# Patient Record
Sex: Male | Born: 1970 | Race: Black or African American | Hispanic: No | Marital: Married | Smoking: Never smoker
Health system: Southern US, Community
[De-identification: ages and names within clinical notes are randomized; demographics above are authoritative.]

## PROBLEM LIST (undated history)

## (undated) DIAGNOSIS — M1990 Unspecified osteoarthritis, unspecified site: Secondary | ICD-10-CM

## (undated) DIAGNOSIS — J45909 Unspecified asthma, uncomplicated: Secondary | ICD-10-CM

## (undated) HISTORY — PX: MENISCUS REPAIR: SHX5179

## (undated) HISTORY — PX: SHOULDER ARTHROSCOPY WITH ROTATOR CUFF REPAIR: SHX5685

## (undated) HISTORY — PX: WRIST SURGERY: SHX841

---

## 1999-07-18 ENCOUNTER — Emergency Department (HOSPITAL_COMMUNITY): Admission: EM | Admit: 1999-07-18 | Discharge: 1999-07-19 | Payer: Self-pay | Admitting: Emergency Medicine

## 1999-07-19 ENCOUNTER — Encounter: Payer: Self-pay | Admitting: Emergency Medicine

## 2003-10-26 ENCOUNTER — Emergency Department (HOSPITAL_COMMUNITY): Admission: EM | Admit: 2003-10-26 | Discharge: 2003-10-26 | Payer: Self-pay | Admitting: Emergency Medicine

## 2003-12-25 ENCOUNTER — Emergency Department (HOSPITAL_COMMUNITY): Admission: EM | Admit: 2003-12-25 | Discharge: 2003-12-25 | Payer: Self-pay | Admitting: Emergency Medicine

## 2003-12-28 ENCOUNTER — Emergency Department (HOSPITAL_COMMUNITY): Admission: EM | Admit: 2003-12-28 | Discharge: 2003-12-28 | Payer: Self-pay | Admitting: *Deleted

## 2007-03-04 ENCOUNTER — Emergency Department (HOSPITAL_COMMUNITY): Admission: EM | Admit: 2007-03-04 | Discharge: 2007-03-04 | Payer: Self-pay | Admitting: Emergency Medicine

## 2014-06-26 ENCOUNTER — Other Ambulatory Visit (HOSPITAL_COMMUNITY): Payer: Self-pay | Admitting: Respiratory Therapy

## 2014-06-26 DIAGNOSIS — J45909 Unspecified asthma, uncomplicated: Secondary | ICD-10-CM

## 2014-06-28 ENCOUNTER — Inpatient Hospital Stay (HOSPITAL_COMMUNITY): Admission: RE | Admit: 2014-06-28 | Payer: Self-pay | Source: Ambulatory Visit

## 2014-07-17 ENCOUNTER — Ambulatory Visit (HOSPITAL_COMMUNITY)
Admission: RE | Admit: 2014-07-17 | Discharge: 2014-07-17 | Disposition: A | Discharge status: Home / Self Care | Payer: Managed Care, Other (non HMO) | Source: Ambulatory Visit | Attending: Family Medicine | Admitting: Family Medicine

## 2014-07-17 DIAGNOSIS — J45909 Unspecified asthma, uncomplicated: Secondary | ICD-10-CM | POA: Insufficient documentation

## 2014-07-17 MED ORDER — ALBUTEROL SULFATE (2.5 MG/3ML) 0.083% IN NEBU
2.5000 mg | INHALATION_SOLUTION | Freq: Once | RESPIRATORY_TRACT | Status: AC
  Administered 2014-07-17: 2.5 mg via RESPIRATORY_TRACT

## 2014-07-18 LAB — PULMONARY FUNCTION TEST
DL/VA % pred: 97 %
DL/VA: 4.7 ml/min/mmHg/L
DLCO cor % pred: 86 %
DLCO cor: 31.38 ml/min/mmHg
DLCO unc % pred: 86 %
DLCO unc: 31.38 ml/min/mmHg
FEF 25-75 Post: 4.56 L/sec
FEF 25-75 Pre: 3.86 L/sec
FEF2575-%Change-Post: 18 %
FEF2575-%Pred-Post: 118 %
FEF2575-%Pred-Pre: 99 %
FEV1-%Change-Post: 2 %
FEV1-%Pred-Post: 102 %
FEV1-%Pred-Pre: 100 %
FEV1-Post: 3.96 L
FEV1-Pre: 3.86 L
FEV1FVC-%Change-Post: 1 %
FEV1FVC-%Pred-Pre: 102 %
FEV6-%Change-Post: 0 %
FEV6-%Pred-Post: 100 %
FEV6-%Pred-Pre: 99 %
FEV6-Post: 4.7 L
FEV6-Pre: 4.66 L
FEV6FVC-%Change-Post: 0 %
FEV6FVC-%Pred-Post: 101 %
FEV6FVC-%Pred-Pre: 102 %
FVC-%Change-Post: 1 %
FVC-%Pred-Post: 99 %
FVC-%Pred-Pre: 97 %
FVC-Post: 4.72 L
FVC-Pre: 4.66 L
Post FEV1/FVC ratio: 84 %
Post FEV6/FVC ratio: 100 %
Pre FEV1/FVC ratio: 83 %
Pre FEV6/FVC Ratio: 100 %
RV % pred: 45 %
RV: 0.94 L
TLC % pred: 73 %
TLC: 5.58 L

## 2014-10-05 DIAGNOSIS — Y9289 Other specified places as the place of occurrence of the external cause: Secondary | ICD-10-CM | POA: Insufficient documentation

## 2014-10-05 DIAGNOSIS — S8391XA Sprain of unspecified site of right knee, initial encounter: Secondary | ICD-10-CM | POA: Diagnosis not present

## 2014-10-05 DIAGNOSIS — S43402A Unspecified sprain of left shoulder joint, initial encounter: Secondary | ICD-10-CM | POA: Insufficient documentation

## 2014-10-05 DIAGNOSIS — S233XXA Sprain of ligaments of thoracic spine, initial encounter: Secondary | ICD-10-CM | POA: Insufficient documentation

## 2014-10-05 DIAGNOSIS — W1839XA Other fall on same level, initial encounter: Secondary | ICD-10-CM | POA: Diagnosis not present

## 2014-10-05 DIAGNOSIS — Y998 Other external cause status: Secondary | ICD-10-CM | POA: Insufficient documentation

## 2014-10-05 DIAGNOSIS — S29012A Strain of muscle and tendon of back wall of thorax, initial encounter: Secondary | ICD-10-CM | POA: Diagnosis not present

## 2014-10-05 DIAGNOSIS — S63502A Unspecified sprain of left wrist, initial encounter: Secondary | ICD-10-CM | POA: Diagnosis not present

## 2014-10-05 DIAGNOSIS — S6992XA Unspecified injury of left wrist, hand and finger(s), initial encounter: Secondary | ICD-10-CM | POA: Diagnosis present

## 2014-10-05 DIAGNOSIS — Y9389 Activity, other specified: Secondary | ICD-10-CM | POA: Insufficient documentation

## 2014-10-05 DIAGNOSIS — S39012A Strain of muscle, fascia and tendon of lower back, initial encounter: Secondary | ICD-10-CM | POA: Insufficient documentation

## 2014-10-06 ENCOUNTER — Encounter (HOSPITAL_BASED_OUTPATIENT_CLINIC_OR_DEPARTMENT_OTHER): Payer: Self-pay | Admitting: *Deleted

## 2014-10-06 ENCOUNTER — Emergency Department (HOSPITAL_BASED_OUTPATIENT_CLINIC_OR_DEPARTMENT_OTHER)
Admission: EM | Admit: 2014-10-06 | Discharge: 2014-10-06 | Disposition: A | Discharge status: Home / Self Care | Payer: Worker's Compensation | Attending: Emergency Medicine | Admitting: Emergency Medicine

## 2014-10-06 ENCOUNTER — Emergency Department (HOSPITAL_BASED_OUTPATIENT_CLINIC_OR_DEPARTMENT_OTHER): Payer: Worker's Compensation

## 2014-10-06 DIAGNOSIS — W19XXXA Unspecified fall, initial encounter: Secondary | ICD-10-CM

## 2014-10-06 DIAGNOSIS — S8391XA Sprain of unspecified site of right knee, initial encounter: Secondary | ICD-10-CM

## 2014-10-06 DIAGNOSIS — S29012A Strain of muscle and tendon of back wall of thorax, initial encounter: Secondary | ICD-10-CM

## 2014-10-06 DIAGNOSIS — S43402A Unspecified sprain of left shoulder joint, initial encounter: Secondary | ICD-10-CM

## 2014-10-06 DIAGNOSIS — S233XXA Sprain of ligaments of thoracic spine, initial encounter: Secondary | ICD-10-CM

## 2014-10-06 DIAGNOSIS — S39012A Strain of muscle, fascia and tendon of lower back, initial encounter: Secondary | ICD-10-CM

## 2014-10-06 DIAGNOSIS — S63502A Unspecified sprain of left wrist, initial encounter: Secondary | ICD-10-CM

## 2014-10-06 HISTORY — PX: SHOULDER ARTHROSCOPY WITH ROTATOR CUFF REPAIR: SHX5685

## 2014-10-06 MED ORDER — HYDROCODONE-ACETAMINOPHEN 5-325 MG PO TABS: 1.0000 | ORAL_TABLET | Freq: Four times a day (QID) | ORAL | Status: DC | PRN

## 2014-10-06 MED ORDER — HYDROCODONE-ACETAMINOPHEN 5-325 MG PO TABS
2.0000 | ORAL_TABLET | Freq: Once | ORAL | Status: AC
  Administered 2014-10-06: 2 via ORAL
  Filled 2014-10-06: qty 2

## 2014-10-06 NOTE — ED Notes (Signed)
Tolerating fluids at d/c, drinking soda, ambulatory to d/c desk, given Rx x1, steady slow cautious gait, sling and splint in place. Pt's supervisor here to drive pt home.

## 2014-10-06 NOTE — ED Notes (Signed)
Pt. Reports he was climbing in a truck and the steps broke causing him to fall to the ground.  Pt. Reports he flipped to the ground injuring his L shoulder and R knee and lower back.  Pt. Is able to walk and stand with no difficulty.  Pt. Reports also reports stiffness in the L wrist.

## 2014-10-06 NOTE — ED Provider Notes (Signed)
CSN: [id number]     Arrival date & time 10/05/14  2358 History   First MD Initiated Contact with Patient 10/06/14 0139     Chief Complaint  Patient presents with  . Fall     (Consider location/radiation/quality/duration/timing/severity/associated sxs/prior Treatment) HPI  This is a 44 year old male who was climbing out of a truck about 11 PM yesterday evening. The steps broke and he fell to the ground. He is complaining of severe pain and decreased range of motion in his left shoulder, pain and decreased range of motion in his left wrist with paresthesias of the fingers of that hand. He is also having pain in his lower back in his anterolateral right knee. There is no associated deformity. There was no loss of consciousness. Pain is worse with palpation or movement.  History reviewed. No pertinent past medical history. Past Surgical History  Procedure Laterality Date  . Shoulder arthroscopy with rotator cuff repair      Right   No family history on file. History  Substance Use Topics  . Smoking status: Never Smoker   . Smokeless tobacco: Not on file  . Alcohol Use: Not on file    Review of Systems  All other systems reviewed and are negative.   Allergies  Review of patient's allergies indicates no known allergies.  Home Medications   Prior to Admission medications   Not on File   BP 153/96 mmHg  Pulse 72  Temp(Src) 98.5 F (36.9 C) (Oral)  Resp 18  Ht 6' (1.829 m)  Wt 240 lb (108.863 kg)  BMI 32.54 kg/m2  SpO2 98%   Physical Exam  General: Well-developed, well-nourished male in no acute distress; appearance consistent with age of record HENT: normocephalic; atraumatic Eyes: pupils equal, round and reactive to light; extraocular muscles intact Neck: supple; no C-spine tenderness Heart: regular rate and rhythm Lungs: clear to auscultation bilaterally Chest: Nontender Abdomen: soft; nondistended; nontender; bowel sounds present Back: Mild midthoracic tenderness  without step off; moderate lumbar tenderness without step off Extremities: No deformity; full range of motion except for left shoulder and left wrist limited due to pain; pulses normal; tenderness, mild swelling and pain on attempted range of motion of left shoulder; tenderness and pain on attempted range of left wrist with positive snuffbox tenderness; mild right anterolateral knee tenderness without swelling, effusion or instability Neurologic: Awake, alert and oriented; motor function intact in all extremities and symmetric; no facial droop Skin: Warm and dry Psychiatric: Normal mood and affect    ED Course  Procedures (including critical care time)   MDM  Nursing notes and vitals signs, including pulse oximetry, reviewed.  Summary of this visit's results, reviewed by myself:  Imaging Studies: Dg Thoracic Spine 2 View  10/06/2014   CLINICAL DATA:  Larey Seat this morning while getting out of truck, step broke. Mid and upper back pain.  EXAM: THORACIC SPINE - 2 VIEW  COMPARISON:  None.  FINDINGS: There is no evidence of thoracic spine fracture. Alignment is normal. Multilevel mild degenerative discs. No other significant bone abnormalities are identified.  IMPRESSION: No acute fracture deformity or malalignment.  Multilevel mild degenerative discs.   Electronically Signed   By: Awilda Metro   On: 10/06/2014 02:47   Dg Lumbar Spine Complete  10/06/2014   CLINICAL DATA:  Larey Seat this morning while getting out of truck, step broke. Mid and upper back pain.  EXAM: LUMBAR SPINE - COMPLETE 4+ VIEW  COMPARISON:  None.  FINDINGS: Five non rib-bearing lumbar-type  vertebral bodies are intact and aligned with maintenance of the lumbar lordosis. No pars interarticularis defects. Intervertebral disc heights are normal. No destructive bony lesions.  Sacroiliac joints are symmetric. Included prevertebral and paraspinal soft tissue planes are non-suspicious.  IMPRESSION: Negative.   Electronically Signed   By:  Awilda Metro   On: 10/06/2014 02:48   Dg Wrist Complete Left  10/06/2014   CLINICAL DATA:  Larey Seat this morning while getting out of truck, step broke. Mid and upper back pain.  EXAM: LEFT WRIST - COMPLETE 3+ VIEW  COMPARISON:  None.  FINDINGS: No acute fracture deformity or dislocation. Mild radial ulnar osteoarthrosis. Foreshortened ulnar styloid suggest remote injury. Joint space intact without erosions. No destructive bony lesions. Soft tissue planes are not suspicious.  IMPRESSION: No acute osseous process.   Electronically Signed   By: Awilda Metro   On: 10/06/2014 02:50   Dg Shoulder Left  10/06/2014   CLINICAL DATA:  Larey Seat this morning while getting out of truck, step broke. Mid and upper back pain.  EXAM: LEFT SHOULDER - 2+ VIEW  COMPARISON:  None.  FINDINGS: There is no evidence of fracture or dislocation. There is no evidence of arthropathy or other focal bone abnormality. Soft tissues are unremarkable.  IMPRESSION: Negative.   Electronically Signed   By: Awilda Metro   On: 10/06/2014 02:48   Dg Knee Complete 4 Views Right  10/06/2014   CLINICAL DATA:  Larey Seat this morning while getting out of truck, step broke. Mid and upper back pain.  EXAM: RIGHT KNEE - COMPLETE 4+ VIEW  COMPARISON:  None.  FINDINGS: No acute fracture deformity or dislocation. Joint space intact without erosions. Mild tibial spine peaking. No destructive bony lesions. Small suprapatellar joint effusion.  IMPRESSION: Small suprapatellar joint effusion without fracture deformity or dislocation.   Electronically Signed   By: Awilda Metro   On: 10/06/2014 02:54   3:12 AM We'll splint patient's left wrist for possible occult scaphoid fracture. He will follow-up with [facility].   Hanley Seamen, MD 10/06/14 [phone]

## 2014-10-06 NOTE — ED Notes (Signed)
Dr. Molpus at BS 

## 2015-07-26 ENCOUNTER — Other Ambulatory Visit: Payer: Self-pay | Admitting: Allergy and Immunology

## 2015-07-26 NOTE — Telephone Encounter (Signed)
Refilled patients Proair. Patient is due for office visit at the end of the year per Dr Willa RoughHicks dictation

## 2015-09-19 ENCOUNTER — Ambulatory Visit
Admission: RE | Admit: 2015-09-19 | Discharge: 2015-09-19 | Disposition: A | Discharge status: Home / Self Care | Payer: Managed Care, Other (non HMO) | Source: Ambulatory Visit | Attending: Physician Assistant | Admitting: Physician Assistant

## 2015-09-19 ENCOUNTER — Other Ambulatory Visit: Payer: Self-pay | Admitting: Physician Assistant

## 2015-12-08 IMAGING — CR DG WRIST COMPLETE 3+V*L*
4 series · 4 of 4 positions shown · non-contrast
Comparison: None.

CLINICAL DATA: Fell this morning while getting out of truck, step
broke. Mid and upper back pain.

EXAM:
LEFT WRIST - COMPLETE 3+ VIEW

[x wrist pa left]
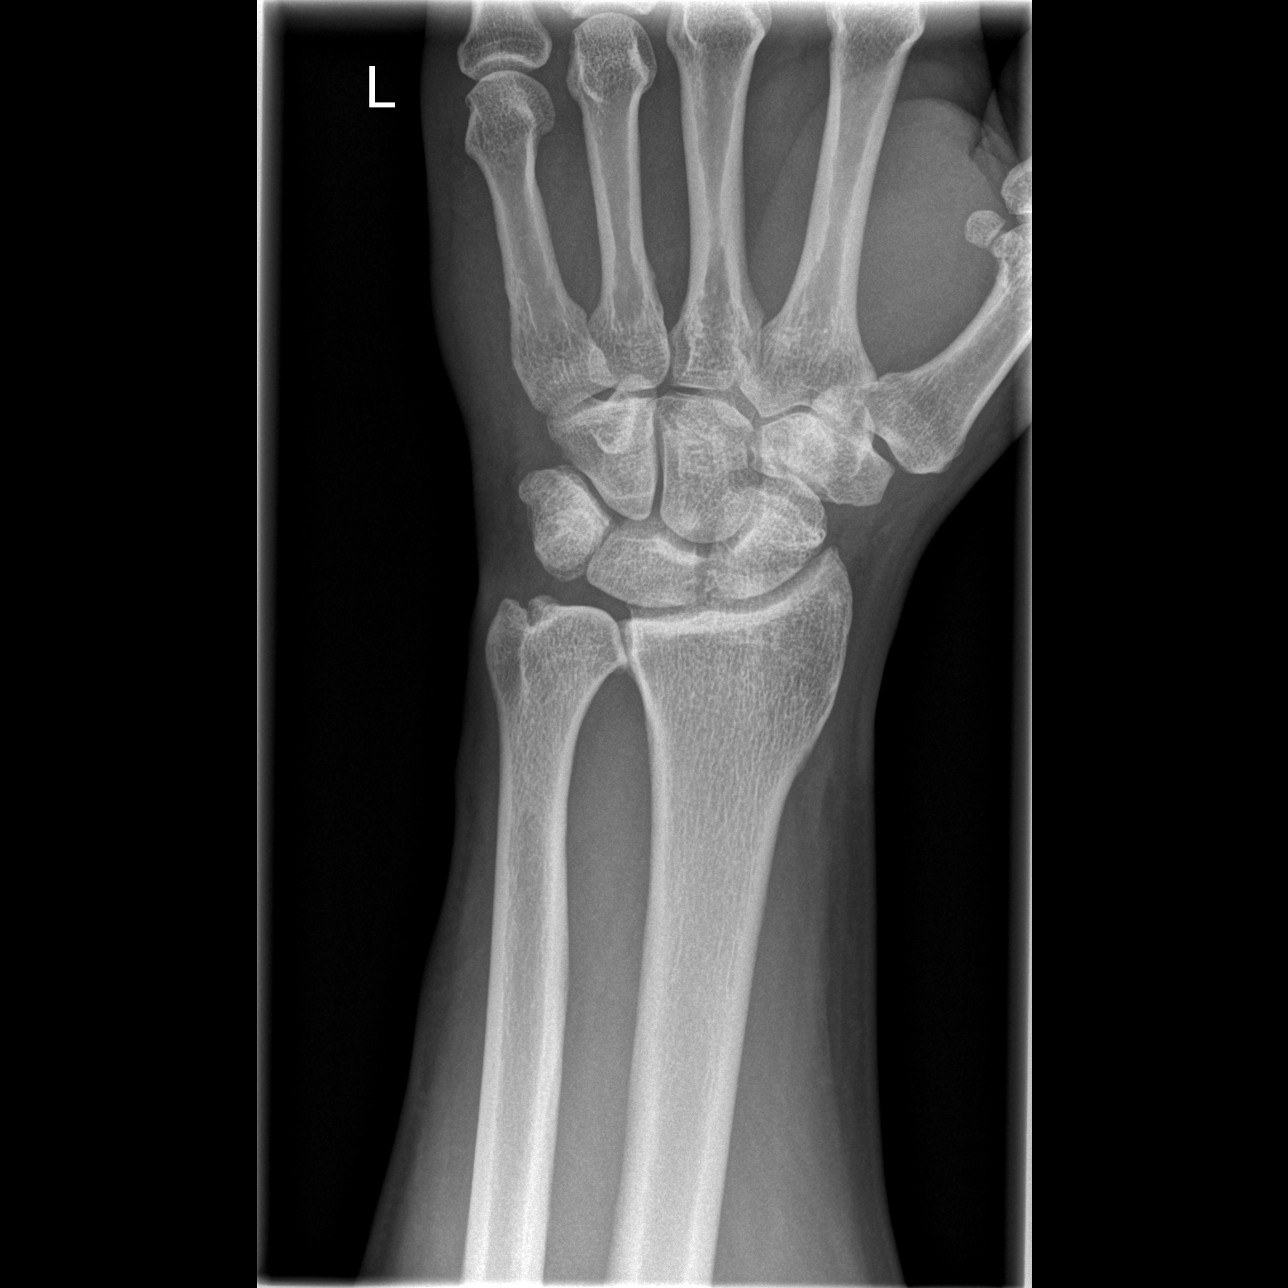

[x wrist obl left]
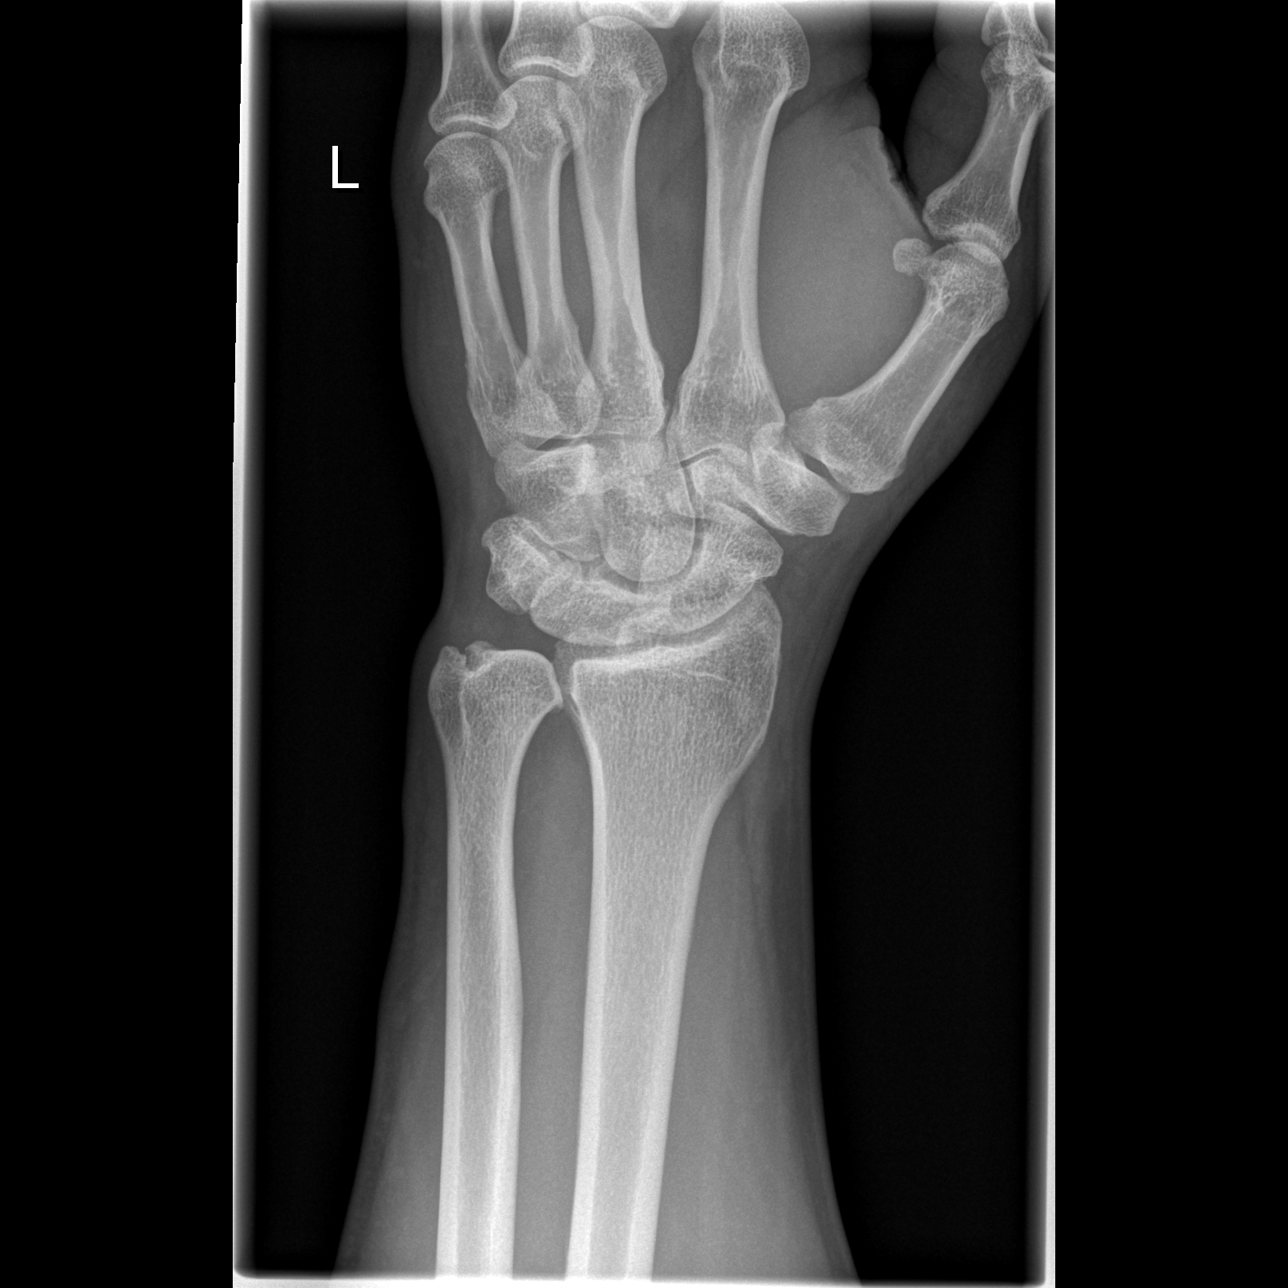

[x wrist lat left]
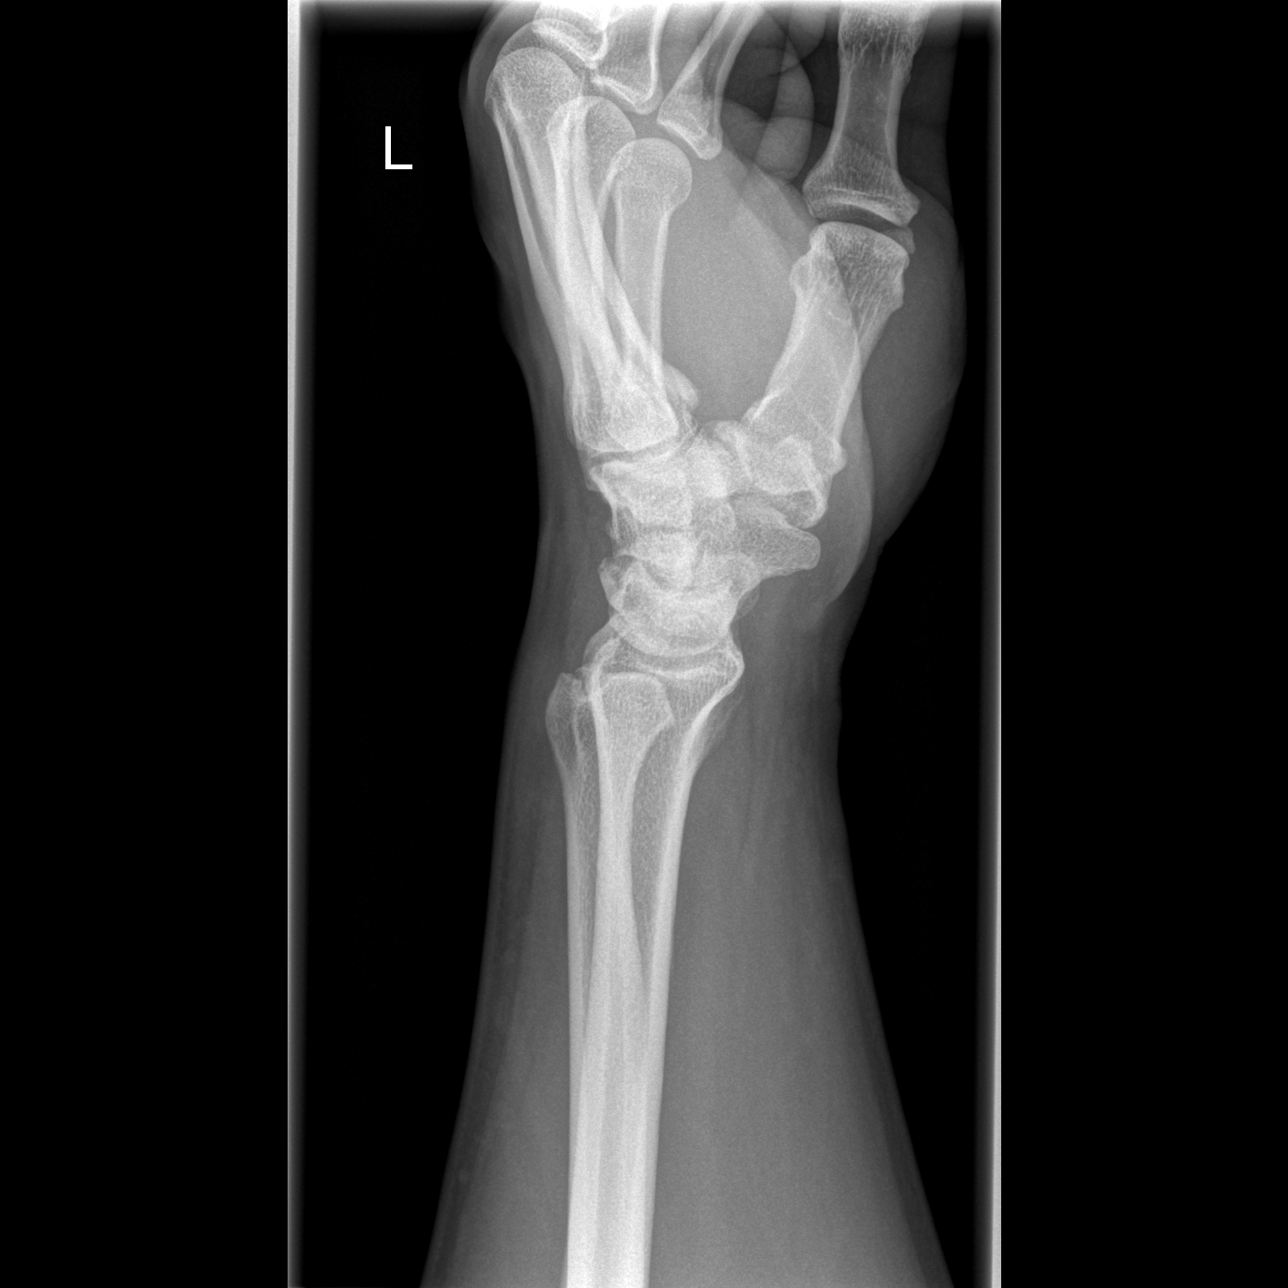

[x navicular]
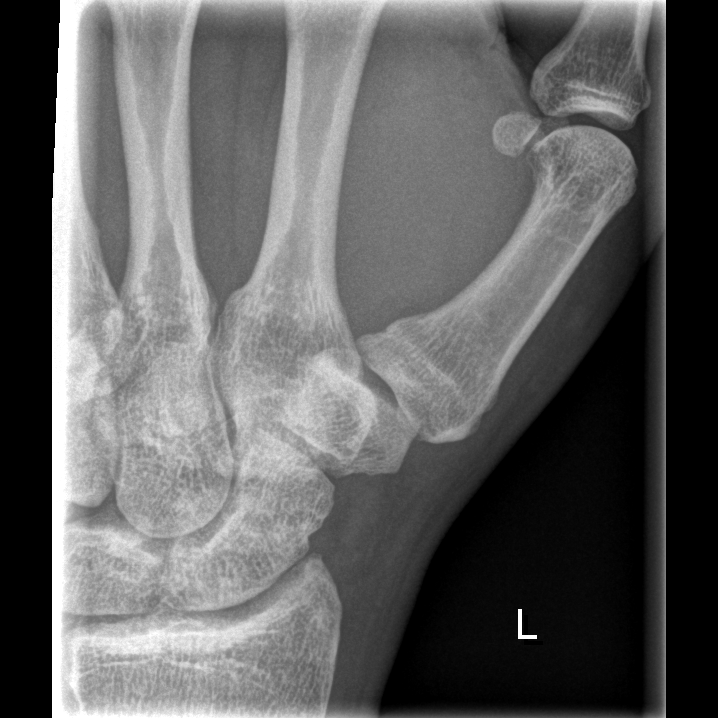

[4 of 4 positions shown; findings below may reference images not displayed]

FINDINGS: No acute fracture deformity or dislocation. Mild radial ulnar
osteoarthrosis. Foreshortened ulnar styloid suggest remote injury.
Joint space intact without erosions. No destructive bony lesions.
Soft tissue planes are not suspicious.
IMPRESSION: No acute osseous process.

  By: Nala Tiger

## 2015-12-08 IMAGING — CR DG LUMBAR SPINE COMPLETE 4+V
5 series · 5 of 5 positions shown · non-contrast
Comparison: None.

CLINICAL DATA: Fell this morning while getting out of truck, step
broke. Mid and upper back pain.

EXAM:
LUMBAR SPINE - COMPLETE 4+ VIEW

[t l-spine a.p.]
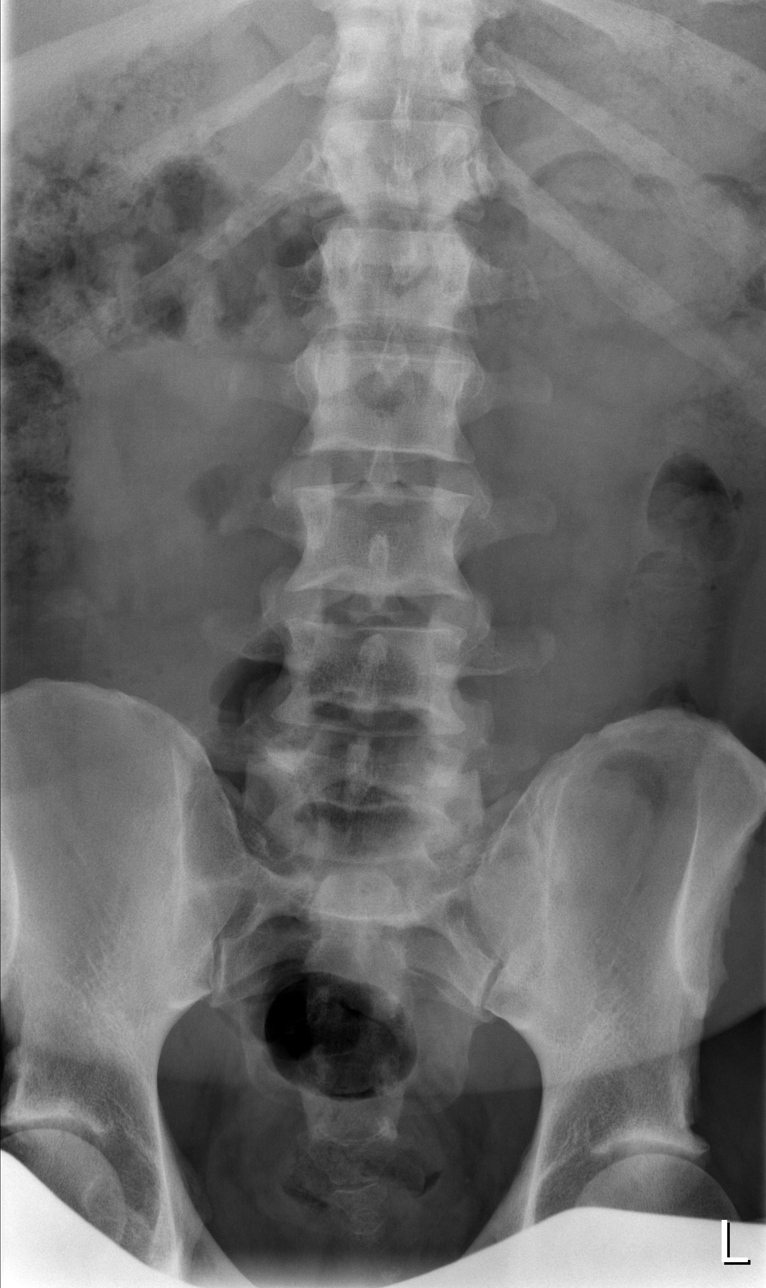

[t l-spine oblique exposure (1 of 2)]
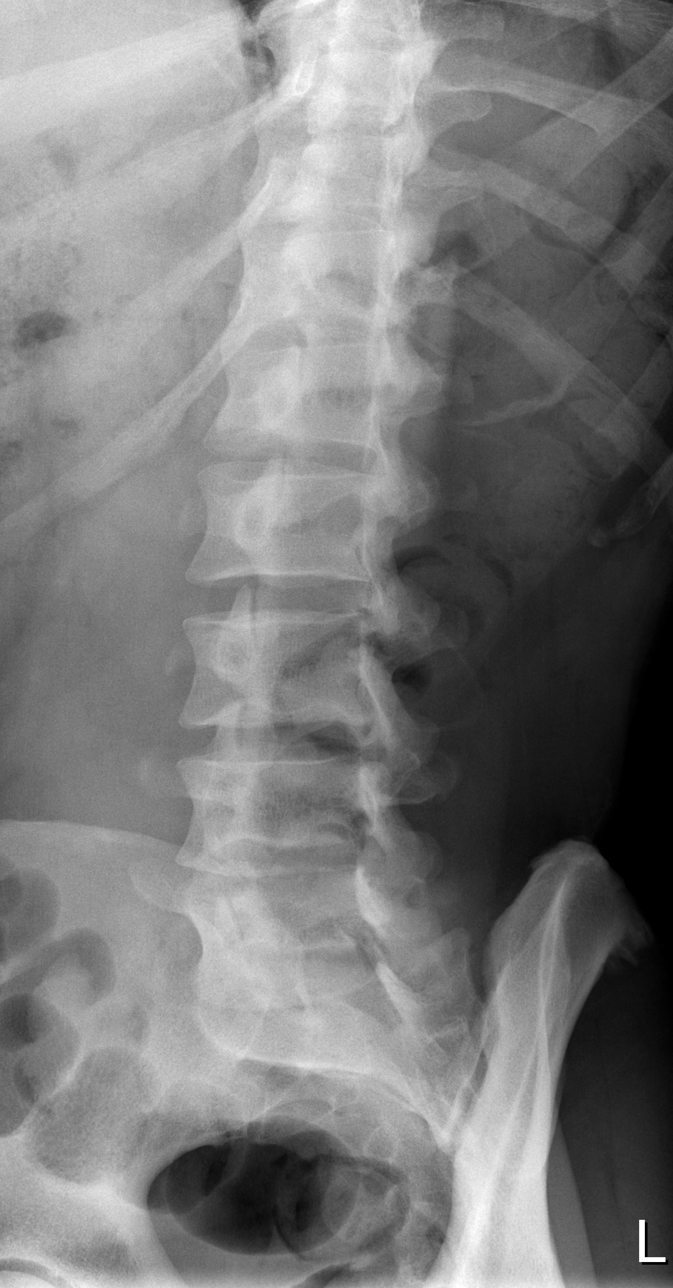

[t l-spine oblique exposure (2 of 2)]
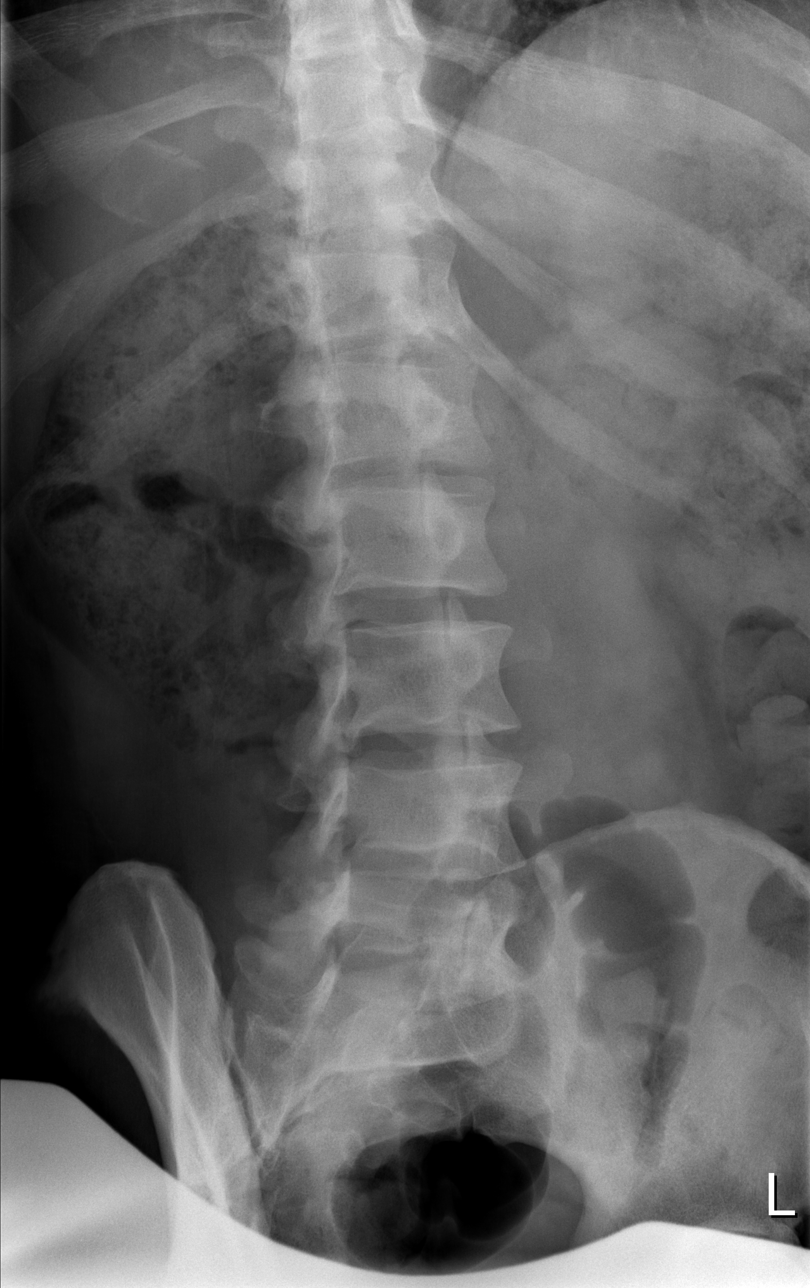

[t l-spine lat]
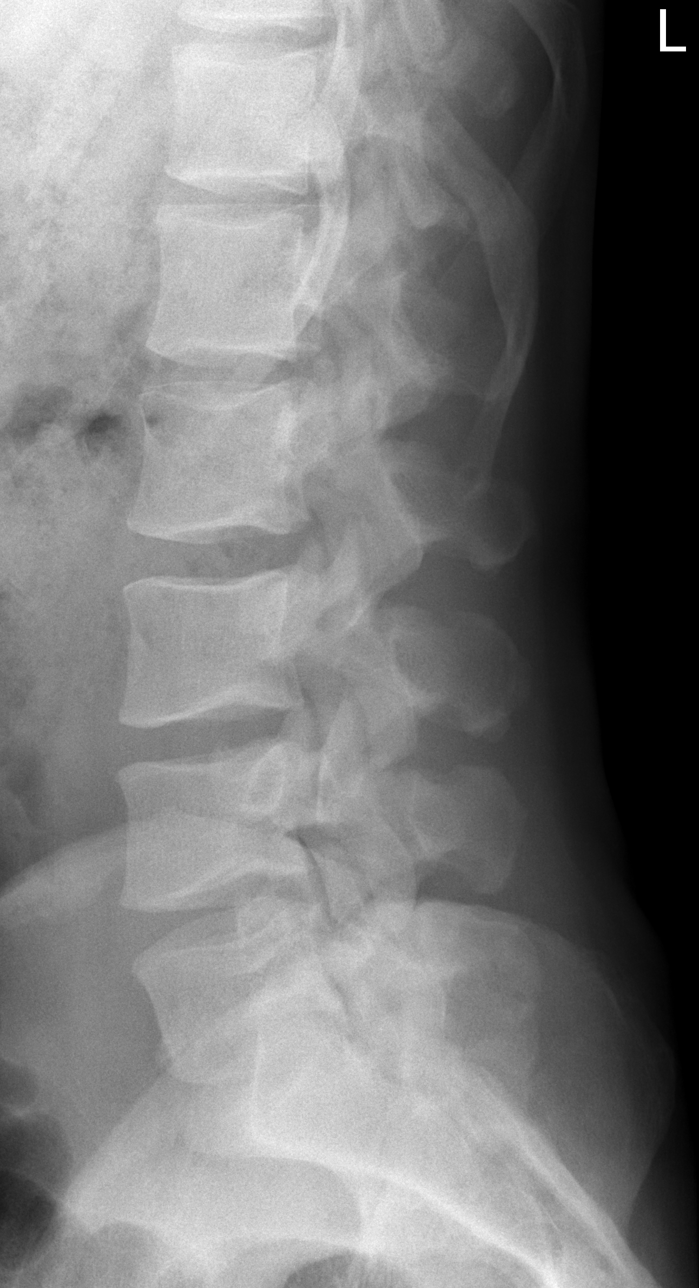

[t l-spine l5-s1 spot]
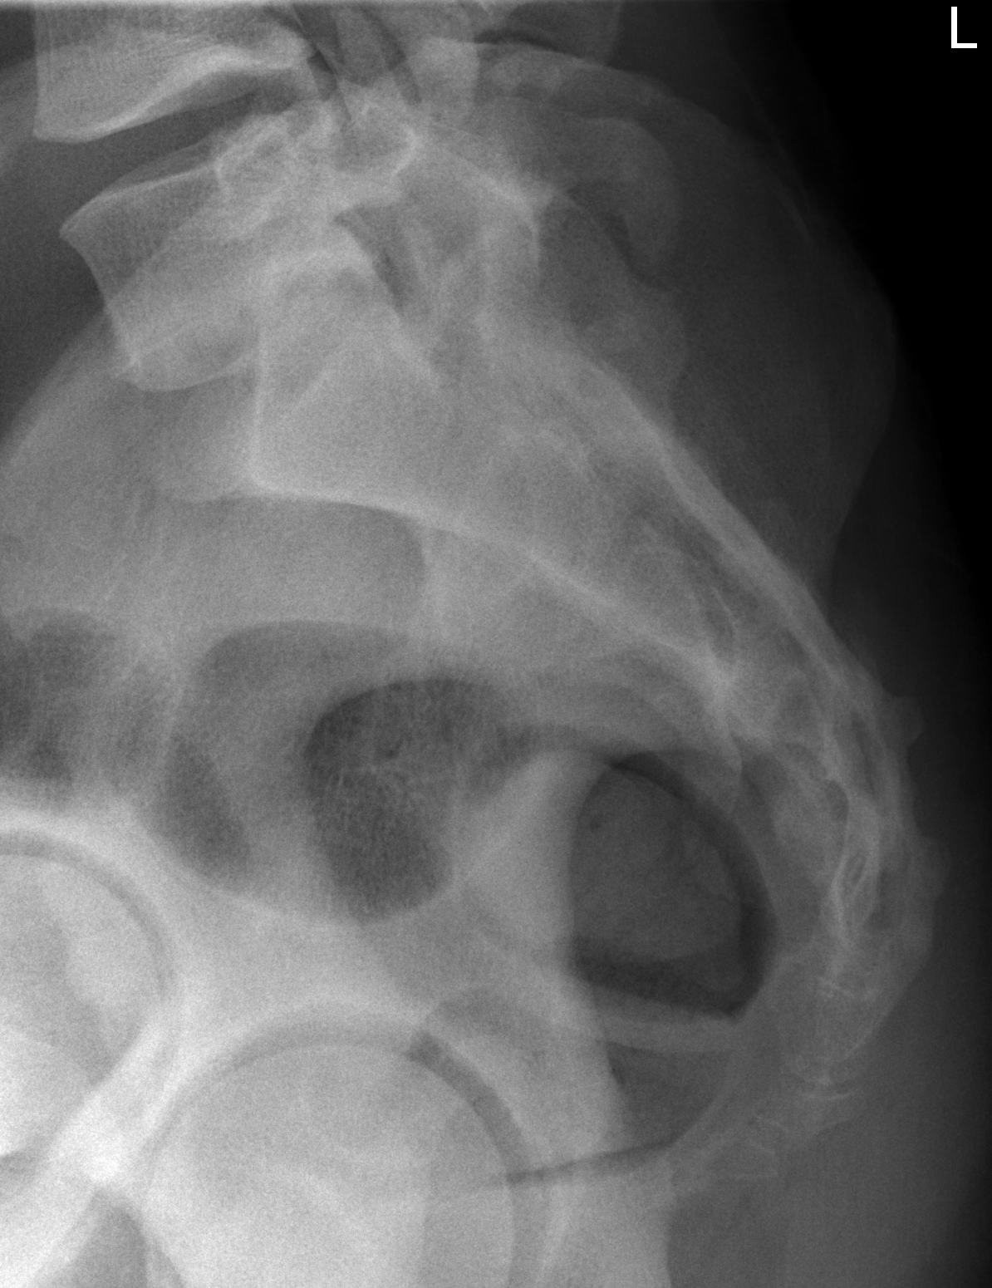

[5 of 5 positions shown; findings below may reference images not displayed]

FINDINGS: Five non rib-bearing lumbar-type vertebral bodies are intact and
aligned with maintenance of the lumbar lordosis. No pars
interarticularis defects. Intervertebral disc heights are normal. No
destructive bony lesions.

Sacroiliac joints are symmetric. Included prevertebral and
paraspinal soft tissue planes are non-suspicious.
IMPRESSION: Negative.

  By: Houssem Edine Syoud

## 2017-10-08 ENCOUNTER — Other Ambulatory Visit: Payer: Self-pay | Admitting: Orthopedic Surgery

## 2017-10-19 ENCOUNTER — Other Ambulatory Visit: Payer: Self-pay | Admitting: Orthopedic Surgery

## 2017-10-20 NOTE — Pre-Procedure Instructions (Signed)
Suszanne ConnersMichael R Puett  10/20/2017      [id number] [place], [place] - [address] [place] [address] Phone: [phone] Fax: [phone]    Your procedure is scheduled on 10/28/2017.  [facility] Ad[facility] at 1045 A.M.  Call this number if you have problems the morning of surgery:  [phone]   Remember:  Do not eat food or drink liquids after midnight.   Continue all medications as directed by your physician except follow these medication instructions before surgery below   Take these medicines the morning of surgery with A SIP OF WATER: Cyclobenzaprine (Flexeril) - if needed for muscle spasms Proair inhaler - if needed  7 days prior to surgery STOP taking any Aspirin (unless otherwise instructed by your Peppers), Aleve, Naproxen, Ibuprofen, Motrin, Advil, Goody's, BC's, all herbal medications, fish oil, and all vitamins    Do not wear jewelry.  Do not wear lotions, powders, or colognes, or deodorant.  Men may shave face and neck.  Do not bring valuables to the hospital.  [facility] is not responsible for any belongings or valuables.  Hearing aids, eyeglasses, contacts, dentures or bridgework may not be worn into surgery.  Leave your suitcase in the car.  After surgery it may be brought to your room.  For patients admitted to the hospital, discharge time will be determined by your treatment team.  Patients discharged the day of surgery will not be allowed to drive home.   Name and phone number of your driver:    Special instructions:   [facility]- Preparing For Surgery  Before surgery, you can play an important role. Because skin is not sterile, your skin needs to be as free of germs as possible. You can reduce the number of germs on your skin by washing with CHG (chlorahexidine gluconate) Soap before surgery.  CHG is an antiseptic cleaner which kills germs and bonds with the skin to continue killing germs even  after washing.  Please do not use if you have an allergy to CHG or antibacterial soaps. If your skin becomes reddened/irritated stop using the CHG.  Do not shave (including legs and underarms) for at least 48 hours prior to first CHG shower. It is OK to shave your face.  Please follow these instructions carefully.   1. Shower the NIGHT BEFORE SURGERY and the MORNING OF SURGERY with CHG.   2. If you chose to wash your hair, wash your hair first as usual with your normal shampoo.  3. After you shampoo, rinse your hair and body thoroughly to remove the shampoo.  4. Use CHG as you would any other liquid soap. You can apply CHG directly to the skin and wash gently with a scrungie or a clean washcloth.   5. Apply the CHG Soap to your body ONLY FROM THE NECK DOWN.  Do not use on open wounds or open sores. Avoid contact with your eyes, ears, mouth and genitals (private parts). Wash Face and genitals (private parts)  with your normal soap.  6. Wash thoroughly, paying special attention to the area where your surgery will be performed.  7. Thoroughly rinse your body with warm water from the neck down.  8. DO NOT shower/wash with your normal soap after using and rinsing off the CHG Soap.  9. Pat yourself dry with a CLEAN TOWEL.  10. Wear CLEAN PAJAMAS to bed the night before surgery, wear comfortable clothes the morning of surgery  11. Place CLEAN SHEETS on your bed the night of your first shower and DO NOT SLEEP WITH PETS.    Day of Surgery: Shower as stated above. Do not apply any deodorants/lotions. Please wear clean clothes to the hospital/surgery center.      Please read over the following fact sheets that you were given.

## 2017-10-21 ENCOUNTER — Encounter (HOSPITAL_COMMUNITY)
Admission: RE | Admit: 2017-10-21 | Discharge: 2017-10-21 | Disposition: A | Discharge status: Home / Self Care | Payer: Worker's Compensation | Source: Ambulatory Visit | Attending: Orthopedic Surgery | Admitting: Orthopedic Surgery

## 2017-10-21 ENCOUNTER — Ambulatory Visit (HOSPITAL_COMMUNITY)
Admission: RE | Admit: 2017-10-21 | Discharge: 2017-10-21 | Disposition: A | Discharge status: Home / Self Care | Payer: 59 | Source: Ambulatory Visit | Attending: Orthopedic Surgery | Admitting: Orthopedic Surgery

## 2017-10-21 ENCOUNTER — Encounter (HOSPITAL_COMMUNITY): Payer: Self-pay

## 2017-10-21 ENCOUNTER — Other Ambulatory Visit: Payer: Self-pay

## 2017-10-21 DIAGNOSIS — Z01810 Encounter for preprocedural cardiovascular examination: Secondary | ICD-10-CM | POA: Diagnosis not present

## 2017-10-21 DIAGNOSIS — Z01812 Encounter for preprocedural laboratory examination: Secondary | ICD-10-CM | POA: Insufficient documentation

## 2017-10-21 DIAGNOSIS — Z01818 Encounter for other preprocedural examination: Secondary | ICD-10-CM | POA: Insufficient documentation

## 2017-10-21 HISTORY — DX: Unspecified asthma, uncomplicated: J45.909

## 2017-10-21 LAB — SURGICAL PCR SCREEN
MRSA, PCR: NEGATIVE
Staphylococcus aureus: NEGATIVE

## 2017-10-21 LAB — URINALYSIS, ROUTINE W REFLEX MICROSCOPIC
Bilirubin Urine: NEGATIVE
GLUCOSE, UA: NEGATIVE mg/dL
Hgb urine dipstick: NEGATIVE
KETONES UR: NEGATIVE mg/dL
LEUKOCYTES UA: NEGATIVE
NITRITE: NEGATIVE
Protein, ur: NEGATIVE mg/dL
Specific Gravity, Urine: 1.019 (ref 1.005–1.030)
pH: 7 (ref 5.0–8.0)

## 2017-10-21 LAB — CBC WITH DIFFERENTIAL/PLATELET
Basophils Absolute: 0 10*3/uL (ref 0.0–0.1)
Basophils Relative: 1 %
Eosinophils Absolute: 0 10*3/uL (ref 0.0–0.7)
Eosinophils Relative: 1 %
HEMATOCRIT: 46 % (ref 39.0–52.0)
HEMOGLOBIN: 15 g/dL (ref 13.0–17.0)
LYMPHS PCT: 46 %
Lymphs Abs: 1.9 10*3/uL (ref 0.7–4.0)
MCH: 24.5 pg — AB (ref 26.0–34.0)
MCHC: 32.6 g/dL (ref 30.0–36.0)
MCV: 75.2 fL — ABNORMAL LOW (ref 78.0–100.0)
MONO ABS: 0.3 10*3/uL (ref 0.1–1.0)
MONOS PCT: 8 %
NEUTROS ABS: 1.8 10*3/uL (ref 1.7–7.7)
NEUTROS PCT: 44 %
Platelets: 100 10*3/uL — ABNORMAL LOW (ref 150–400)
RBC: 6.12 MIL/uL — ABNORMAL HIGH (ref 4.22–5.81)
RDW: 15.2 % (ref 11.5–15.5)
WBC: 4 10*3/uL (ref 4.0–10.5)

## 2017-10-21 LAB — BASIC METABOLIC PANEL
ANION GAP: 8 (ref 5–15)
BUN: 13 mg/dL (ref 6–20)
CO2: 26 mmol/L (ref 22–32)
Calcium: 9.5 mg/dL (ref 8.9–10.3)
Chloride: 109 mmol/L (ref 101–111)
Creatinine, Ser: 1.4 mg/dL — ABNORMAL HIGH (ref 0.61–1.24)
GFR calc non Af Amer: 58 mL/min — ABNORMAL LOW (ref >60)
GLUCOSE: 92 mg/dL (ref 65–99)
Potassium: 4 mmol/L (ref 3.5–5.1)
Sodium: 143 mmol/L (ref 135–145)

## 2017-10-21 LAB — TYPE AND SCREEN
ABO/RH(D): O POS
Antibody Screen: NEGATIVE

## 2017-10-21 LAB — ABO/RH: ABO/RH(D): O POS

## 2017-10-21 LAB — PROTIME-INR
INR: 1.01
Prothrombin Time: 13.2 seconds (ref 11.4–15.2)

## 2017-10-21 LAB — APTT: aPTT: 32 seconds (ref 24–36)

## 2017-10-21 NOTE — Progress Notes (Addendum)
PCP - Dr. Maurice SmallElaine Griffin Cardiologist - patient denies  Chest x-ray - 10/21/17 EKG - 10/21/17 Stress Test - patient denies ECHO - patient denies Cardiac Cath - patient denies  Sleep Study - patient denies  Anesthesia review: yes, abnormal EKG  Patient denies shortness of breath, fever, cough and chest pain at PAT appointment   Patient verbalized understanding of instructions that were given to them at the PAT appointment. Patient was also instructed that they will need to review over the PAT instructions again at home before surgery.

## 2017-10-23 NOTE — Progress Notes (Signed)
Anesthesia Chart Review:  Pt is a 47 year old male scheduled for R total knee arthroplasty on 10/28/2017 with Gean BirchwoodFrank Rowan, MD  - PCP is Maurice SmallElaine Griffin, MD  PMH includes:  Asthma. No hx HTN. Never smoker. BMI 34  Medications include: albuterol, ibuprofen.    BP (!) 153/90   Pulse 77   Temp 37.1 C   Resp 20   Ht 5\' 11"  (1.803 m)   Wt 242 lb 9.6 oz (110 kg)   SpO2 99%   BMI 33.84 kg/m   Preoperative labs reviewed.   - Cr 1.4, BUN 13.  I have not been able to locate comparison labs.  - I spoke with pt by telephone. No hx CKD. Pt will increase water intake. Will recheck renal function day of surgery.   EKG 10/21/17: NSR with sinus arrhythmia. Nonspecific T wave abnormality  I forwarded BP measure and BMET to PCP for follow up purposes.   If no changes, I anticipate pt can proceed with surgery as scheduled.   Rica Mastngela Tyteanna Ost, FNP-BC [facility] Phone: [phone] 10/23/2017 9:46 AM

## 2017-10-27 DIAGNOSIS — R030 Elevated blood-pressure reading, without diagnosis of hypertension: Secondary | ICD-10-CM | POA: Diagnosis not present

## 2017-10-27 DIAGNOSIS — N182 Chronic kidney disease, stage 2 (mild): Secondary | ICD-10-CM | POA: Diagnosis not present

## 2017-10-27 DIAGNOSIS — M1711 Unilateral primary osteoarthritis, right knee: Secondary | ICD-10-CM | POA: Diagnosis present

## 2017-10-27 MED ORDER — BUPIVACAINE LIPOSOME 1.3 % IJ SUSP
20.0000 mL | Freq: Once | INTRAMUSCULAR | Status: AC
  Administered 2017-10-28: 20 mL
  Filled 2017-10-27: qty 20

## 2017-10-27 MED ORDER — SODIUM CHLORIDE 0.9 % IV SOLN
1000.0000 mg | INTRAVENOUS | Status: AC
  Administered 2017-10-28: 1000 mg via INTRAVENOUS
  Filled 2017-10-27: qty 1100

## 2017-10-27 MED ORDER — SODIUM CHLORIDE 0.9 % IV SOLN
2000.0000 mg | INTRAVENOUS | Status: AC
  Administered 2017-10-28: 2000 mg via TOPICAL
  Filled 2017-10-27: qty 10

## 2017-10-27 NOTE — H&P (Signed)
TOTAL KNEE ADMISSION H&P  Patient is being admitted for right total knee arthroplasty.  Subjective:  Chief Complaint:right knee pain.  HPI: Steven ConnersMichael R Ramsey, 47 y.o. male, has a history of pain and functional disability in the right knee due to arthritis and has failed non-surgical conservative treatments for greater than 12 weeks to includeNSAID's and/or analgesics, corticosteriod injections, flexibility and strengthening excercises and activity modification.  Onset of symptoms was gradual, starting several years ago with gradually worsening course since that time. The patient noted prior procedures on the knee to include  arthroscopy on the right knee(s).  Patient currently rates pain in the right knee(s) at 10 out of 10 with activity. Patient has night pain, worsening of pain with activity and weight bearing, pain that interferes with activities of daily living, pain with passive range of motion, crepitus and joint swelling.  Patient has evidence of joint space narrowing by imaging studies.   There is no active infection.  There are no active problems to display for this patient.  Past Medical History:  Diagnosis Date  . Asthma     Past Surgical History:  Procedure Laterality Date  . MENISCUS REPAIR Right   . SHOULDER ARTHROSCOPY WITH ROTATOR CUFF REPAIR     Right  . WRIST SURGERY Left     No current facility-administered medications for this encounter.    Current Outpatient Medications  Medication Sig Dispense Refill Last Dose  . cyclobenzaprine (FLEXERIL) 5 MG tablet Take 5 mg by mouth 3 (three) times daily as needed for muscle spasms.     Marland Kitchen. ibuprofen (ADVIL,MOTRIN) 200 MG tablet Take 400 mg by mouth every 6 (six) hours as needed for headache or moderate pain.     Marland Kitchen. PROAIR HFA 108 (90 BASE) MCG/ACT inhaler INHALE 2 PUFFS EVERY 6 HOURS AS NEEDED FOR COUGH OR WHEEZE *MAY USE 2 PUFFS 10-20 PRIOR TO EXERCISE 1 Inhaler 1   . HYDROcodone-acetaminophen (NORCO/VICODIN) 5-325 MG per tablet  Take 1-2 tablets by mouth every 6 (six) hours as needed (for pain). (Patient not taking: Reported on 10/16/2017) 30 tablet 0 Completed Course at Unknown time   No Known Allergies  Social History   Tobacco Use  . Smoking status: Never Smoker  . Smokeless tobacco: Never Used  Substance Use Topics  . Alcohol use: No    Frequency: Never    No family history on file.   Review of Systems  Constitutional: Negative.   HENT: Negative.   Eyes: Negative.   Respiratory: Negative.   Cardiovascular: Negative.   Gastrointestinal: Negative.   Genitourinary: Negative.   Musculoskeletal: Positive for joint pain.  Skin: Negative.   Neurological: Negative.   Psychiatric/Behavioral: Positive for depression. The patient is nervous/anxious.     Objective:  Physical Exam  Constitutional: He is oriented to person, place, and time. He appears well-developed and well-nourished.  HENT:  Head: Normocephalic and atraumatic.  Eyes: Pupils are equal, round, and reactive to light.  Neck: Normal range of motion. Neck supple.  Cardiovascular: Intact distal pulses.  Respiratory: Effort normal.  Musculoskeletal: He exhibits tenderness.  Patient has a 5 flexion contracture, flexes 120, is tender along the medial greater than lateral joint line and walks with a profound limp even using his cane.  Skin is intact his neurovascular intact.  Collateral ligaments are stable but varus stress exacerbates his pain.    Neurological: He is alert and oriented to person, place, and time.  Skin: Skin is warm and dry.  Psychiatric: He has  a normal mood and affect. His behavior is normal. Judgment and thought content normal.    Vital signs in last 24 hours:    Labs:   Estimated body mass index is 33.84 kg/m as calculated from the following:   Height as of 10/21/17: 5\' 11"  (1.803 m).   Weight as of 10/21/17: 110 kg (242 lb 9.6 oz).   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  An MRI scan that was done a few months before that also documented significant chondromalacia.    AP, Rosenberg, lateral and sunrise x-rays show moderate to severe arthritis of the right greater than left knee on the standing x-rays.  He still has some cartilage in his joints, but medially he is lost about 50%.  Assessment/Plan:  End stage arthritis, right knee   The patient history, physical examination, clinical judgment of the provider and imaging studies are consistent with end stage degenerative joint disease of the right knee(s) and total knee arthroplasty is deemed medically necessary. The treatment options including medical management, injection therapy arthroscopy and arthroplasty were discussed at length. The risks and benefits of total knee arthroplasty were presented and reviewed. The risks due to aseptic loosening, infection, stiffness, patella tracking problems, thromboembolic complications and other imponderables were discussed. The patient acknowledged the explanation, agreed to proceed with the plan and consent was signed. Patient is being admitted for inpatient treatment for surgery, pain control, PT, OT, prophylactic antibiotics, VTE prophylaxis, progressive ambulation and ADL's and discharge planning. The patient is planning to be discharged home with home health services.

## 2017-10-28 ENCOUNTER — Encounter (HOSPITAL_COMMUNITY): Payer: Self-pay

## 2017-10-28 ENCOUNTER — Inpatient Hospital Stay (HOSPITAL_COMMUNITY): Payer: Worker's Compensation | Admitting: Emergency Medicine

## 2017-10-28 ENCOUNTER — Inpatient Hospital Stay (HOSPITAL_COMMUNITY)
Admission: RE | Admit: 2017-10-28 | Discharge: 2017-10-30 | DRG: 470 | Disposition: A | Discharge status: Home Health | Payer: Worker's Compensation | Source: Ambulatory Visit | Attending: Orthopedic Surgery | Admitting: Orthopedic Surgery

## 2017-10-28 ENCOUNTER — Encounter (HOSPITAL_COMMUNITY)
Admission: RE | Disposition: A | Discharge status: Home Health | Payer: Self-pay | Source: Ambulatory Visit | Attending: Orthopedic Surgery

## 2017-10-28 DIAGNOSIS — Z791 Long term (current) use of non-steroidal anti-inflammatories (NSAID): Secondary | ICD-10-CM

## 2017-10-28 DIAGNOSIS — J45909 Unspecified asthma, uncomplicated: Secondary | ICD-10-CM | POA: Diagnosis present

## 2017-10-28 DIAGNOSIS — M25761 Osteophyte, right knee: Secondary | ICD-10-CM | POA: Diagnosis present

## 2017-10-28 DIAGNOSIS — D62 Acute posthemorrhagic anemia: Secondary | ICD-10-CM | POA: Diagnosis not present

## 2017-10-28 DIAGNOSIS — F419 Anxiety disorder, unspecified: Secondary | ICD-10-CM | POA: Diagnosis present

## 2017-10-28 DIAGNOSIS — F329 Major depressive disorder, single episode, unspecified: Secondary | ICD-10-CM | POA: Diagnosis present

## 2017-10-28 DIAGNOSIS — Z79899 Other long term (current) drug therapy: Secondary | ICD-10-CM

## 2017-10-28 DIAGNOSIS — M1711 Unilateral primary osteoarthritis, right knee: Secondary | ICD-10-CM | POA: Diagnosis present

## 2017-10-28 DIAGNOSIS — M94261 Chondromalacia, right knee: Secondary | ICD-10-CM | POA: Diagnosis present

## 2017-10-28 HISTORY — DX: Unspecified osteoarthritis, unspecified site: M19.90

## 2017-10-28 HISTORY — PX: TOTAL KNEE ARTHROPLASTY: SHX125

## 2017-10-28 SURGERY — ARTHROPLASTY, KNEE, TOTAL
Anesthesia: Regional | Site: Knee | Laterality: Right

## 2017-10-28 MED ORDER — MIDAZOLAM HCL 2 MG/2ML IJ SOLN
2.0000 mg | Freq: Once | INTRAMUSCULAR | Status: DC
  Filled 2017-10-28: qty 2

## 2017-10-28 MED ORDER — ALUM & MAG HYDROXIDE-SIMETH 200-200-20 MG/5ML PO SUSP: 30.0000 mL | ORAL | Status: DC | PRN

## 2017-10-28 MED ORDER — METHOCARBAMOL 500 MG PO TABS: 500.0000 mg | ORAL_TABLET | Freq: Two times a day (BID) | ORAL | 0 refills | Status: DC

## 2017-10-28 MED ORDER — ONDANSETRON HCL 4 MG/2ML IJ SOLN
4.0000 mg | Freq: Four times a day (QID) | INTRAMUSCULAR | Status: DC | PRN
  Administered 2017-10-28: 4 mg via INTRAVENOUS
  Filled 2017-10-28: qty 2

## 2017-10-28 MED ORDER — METOCLOPRAMIDE HCL 5 MG/ML IJ SOLN: 5.0000 mg | Freq: Three times a day (TID) | INTRAMUSCULAR | Status: DC | PRN

## 2017-10-28 MED ORDER — DOCUSATE SODIUM 100 MG PO CAPS
100.0000 mg | ORAL_CAPSULE | Freq: Two times a day (BID) | ORAL | Status: DC
  Administered 2017-10-28 – 2017-10-30 (×4): 100 mg via ORAL
  Filled 2017-10-28 (×4): qty 1

## 2017-10-28 MED ORDER — HYDROMORPHONE HCL 1 MG/ML IJ SOLN
0.5000 mg | INTRAMUSCULAR | Status: DC | PRN
  Administered 2017-10-28: 0.5 mg via INTRAVENOUS
  Filled 2017-10-28: qty 1

## 2017-10-28 MED ORDER — FENTANYL CITRATE (PF) 100 MCG/2ML IJ SOLN
INTRAMUSCULAR | Status: AC
  Filled 2017-10-28: qty 2

## 2017-10-28 MED ORDER — OXYCODONE HCL 5 MG PO TABS: 5.0000 mg | ORAL_TABLET | Freq: Once | ORAL | Status: DC | PRN

## 2017-10-28 MED ORDER — ALBUTEROL SULFATE HFA 108 (90 BASE) MCG/ACT IN AERS: 2.0000 | INHALATION_SPRAY | Freq: Four times a day (QID) | RESPIRATORY_TRACT | Status: DC | PRN

## 2017-10-28 MED ORDER — ONDANSETRON HCL 4 MG PO TABS: 4.0000 mg | ORAL_TABLET | Freq: Four times a day (QID) | ORAL | Status: DC | PRN

## 2017-10-28 MED ORDER — TRANEXAMIC ACID 1000 MG/10ML IV SOLN
1000.0000 mg | Freq: Once | INTRAVENOUS | Status: AC
  Administered 2017-10-28: 1000 mg via INTRAVENOUS
  Filled 2017-10-28: qty 10

## 2017-10-28 MED ORDER — OXYCODONE-ACETAMINOPHEN 5-325 MG PO TABS: 1.0000 | ORAL_TABLET | ORAL | 0 refills | Status: DC | PRN

## 2017-10-28 MED ORDER — PROPOFOL 10 MG/ML IV BOLUS
INTRAVENOUS | Status: AC
  Filled 2017-10-28: qty 20

## 2017-10-28 MED ORDER — FENTANYL CITRATE (PF) 100 MCG/2ML IJ SOLN
100.0000 ug | Freq: Once | INTRAMUSCULAR | Status: DC
Start: 2017-10-28 — End: 2017-10-28
  Filled 2017-10-28: qty 2

## 2017-10-28 MED ORDER — FENTANYL CITRATE (PF) 250 MCG/5ML IJ SOLN
INTRAMUSCULAR | Status: AC
  Filled 2017-10-28: qty 5

## 2017-10-28 MED ORDER — KCL IN DEXTROSE-NACL 20-5-0.45 MEQ/L-%-% IV SOLN
INTRAVENOUS | Status: DC
  Administered 2017-10-28 – 2017-10-29 (×2): via INTRAVENOUS
  Filled 2017-10-28 (×2): qty 1000

## 2017-10-28 MED ORDER — ACETAMINOPHEN 160 MG/5ML PO SOLN: 325.0000 mg | ORAL | Status: DC | PRN

## 2017-10-28 MED ORDER — DIPHENHYDRAMINE HCL 12.5 MG/5ML PO ELIX: 12.5000 mg | ORAL_SOLUTION | ORAL | Status: DC | PRN

## 2017-10-28 MED ORDER — LACTATED RINGERS IV SOLN
INTRAVENOUS | Status: DC
  Administered 2017-10-28 (×2): via INTRAVENOUS

## 2017-10-28 MED ORDER — GABAPENTIN 300 MG PO CAPS: 300.0000 mg | ORAL_CAPSULE | Freq: Three times a day (TID) | ORAL | 2 refills | Status: DC

## 2017-10-28 MED ORDER — BISACODYL 5 MG PO TBEC: 5.0000 mg | DELAYED_RELEASE_TABLET | Freq: Every day | ORAL | Status: DC | PRN

## 2017-10-28 MED ORDER — MIDAZOLAM HCL 2 MG/2ML IJ SOLN
INTRAMUSCULAR | Status: AC
  Filled 2017-10-28: qty 2

## 2017-10-28 MED ORDER — SUGAMMADEX SODIUM 500 MG/5ML IV SOLN
INTRAVENOUS | Status: AC
  Filled 2017-10-28: qty 5

## 2017-10-28 MED ORDER — DEXMEDETOMIDINE HCL 200 MCG/2ML IV SOLN: INTRAVENOUS | Status: DC | PRN

## 2017-10-28 MED ORDER — SENNOSIDES-DOCUSATE SODIUM 8.6-50 MG PO TABS: 1.0000 | ORAL_TABLET | Freq: Every evening | ORAL | Status: DC | PRN

## 2017-10-28 MED ORDER — PROPOFOL 10 MG/ML IV BOLUS
INTRAVENOUS | Status: DC | PRN
  Administered 2017-10-28: 60 mg via INTRAVENOUS
  Administered 2017-10-28: 100 mg via INTRAVENOUS
  Administered 2017-10-28: 200 mg via INTRAVENOUS
  Administered 2017-10-28: 40 mg via INTRAVENOUS

## 2017-10-28 MED ORDER — BUPIVACAINE HCL (PF) 0.25 % IJ SOLN
INTRAMUSCULAR | Status: AC
  Filled 2017-10-28: qty 30

## 2017-10-28 MED ORDER — ARTIFICIAL TEARS OPHTHALMIC OINT
TOPICAL_OINTMENT | OPHTHALMIC | Status: AC
  Filled 2017-10-28: qty 3.5

## 2017-10-28 MED ORDER — DEXAMETHASONE SODIUM PHOSPHATE 10 MG/ML IJ SOLN
INTRAMUSCULAR | Status: AC
  Filled 2017-10-28: qty 1

## 2017-10-28 MED ORDER — DEXAMETHASONE SODIUM PHOSPHATE 10 MG/ML IJ SOLN
INTRAMUSCULAR | Status: DC | PRN
  Administered 2017-10-28: 10 mg via INTRAVENOUS

## 2017-10-28 MED ORDER — ACETAMINOPHEN 650 MG RE SUPP: 650.0000 mg | RECTAL | Status: DC | PRN

## 2017-10-28 MED ORDER — ROPIVACAINE HCL 7.5 MG/ML IJ SOLN
INTRAMUSCULAR | Status: DC | PRN
  Administered 2017-10-28: 20 mL via PERINEURAL

## 2017-10-28 MED ORDER — PHENYLEPHRINE 40 MCG/ML (10ML) SYRINGE FOR IV PUSH (FOR BLOOD PRESSURE SUPPORT)
PREFILLED_SYRINGE | INTRAVENOUS | Status: AC
  Filled 2017-10-28: qty 20

## 2017-10-28 MED ORDER — ONDANSETRON HCL 4 MG/2ML IJ SOLN
INTRAMUSCULAR | Status: AC
  Filled 2017-10-28: qty 2

## 2017-10-28 MED ORDER — PHENYLEPHRINE HCL 10 MG/ML IJ SOLN
INTRAMUSCULAR | Status: DC | PRN
  Administered 2017-10-28: 120 ug via INTRAVENOUS
  Administered 2017-10-28: 80 ug via INTRAVENOUS

## 2017-10-28 MED ORDER — MENTHOL 3 MG MT LOZG
1.0000 | LOZENGE | OROMUCOSAL | Status: DC | PRN
  Administered 2017-10-28: 3 mg via ORAL
  Filled 2017-10-28: qty 9

## 2017-10-28 MED ORDER — DIPHENHYDRAMINE HCL 50 MG/ML IJ SOLN
INTRAMUSCULAR | Status: DC | PRN
  Administered 2017-10-28: 25 mg via INTRAVENOUS

## 2017-10-28 MED ORDER — PHENOL 1.4 % MT LIQD: 1.0000 | OROMUCOSAL | Status: DC | PRN

## 2017-10-28 MED ORDER — ASPIRIN EC 325 MG PO TBEC
325.0000 mg | DELAYED_RELEASE_TABLET | Freq: Every day | ORAL | Status: DC
  Administered 2017-10-29 – 2017-10-30 (×2): 325 mg via ORAL
  Filled 2017-10-28 (×2): qty 1

## 2017-10-28 MED ORDER — GABAPENTIN 300 MG PO CAPS
300.0000 mg | ORAL_CAPSULE | Freq: Three times a day (TID) | ORAL | Status: DC
  Administered 2017-10-28 – 2017-10-30 (×6): 300 mg via ORAL
  Filled 2017-10-28 (×6): qty 1

## 2017-10-28 MED ORDER — HYDROMORPHONE HCL 1 MG/ML IJ SOLN
INTRAMUSCULAR | Status: AC
  Administered 2017-10-28: 0.5 mg via INTRAVENOUS
  Filled 2017-10-28: qty 1

## 2017-10-28 MED ORDER — HYDROMORPHONE HCL 1 MG/ML IJ SOLN
0.2500 mg | INTRAMUSCULAR | Status: DC | PRN
  Administered 2017-10-28 (×2): 0.5 mg via INTRAVENOUS

## 2017-10-28 MED ORDER — CHLORHEXIDINE GLUCONATE 4 % EX LIQD: 60.0000 mL | Freq: Once | CUTANEOUS | Status: DC

## 2017-10-28 MED ORDER — ROCURONIUM BROMIDE 100 MG/10ML IV SOLN
INTRAVENOUS | Status: DC | PRN
  Administered 2017-10-28: 70 mg via INTRAVENOUS

## 2017-10-28 MED ORDER — OXYCODONE HCL 5 MG PO TABS
10.0000 mg | ORAL_TABLET | ORAL | Status: DC | PRN
  Administered 2017-10-28 – 2017-10-30 (×13): 10 mg via ORAL
  Filled 2017-10-28 (×13): qty 2

## 2017-10-28 MED ORDER — METHOCARBAMOL 1000 MG/10ML IJ SOLN
500.0000 mg | Freq: Four times a day (QID) | INTRAMUSCULAR | Status: DC | PRN
  Filled 2017-10-28: qty 5

## 2017-10-28 MED ORDER — FENTANYL CITRATE (PF) 250 MCG/5ML IJ SOLN
INTRAMUSCULAR | Status: DC | PRN
  Administered 2017-10-28 (×2): 100 ug via INTRAVENOUS
  Administered 2017-10-28: 150 ug via INTRAVENOUS

## 2017-10-28 MED ORDER — ALBUTEROL SULFATE (2.5 MG/3ML) 0.083% IN NEBU: 2.5000 mg | INHALATION_SOLUTION | Freq: Four times a day (QID) | RESPIRATORY_TRACT | Status: DC | PRN

## 2017-10-28 MED ORDER — SODIUM CHLORIDE 0.9 % IJ SOLN
INTRAMUSCULAR | Status: DC | PRN
  Administered 2017-10-28: 50 mL

## 2017-10-28 MED ORDER — DEXMEDETOMIDINE HCL 200 MCG/2ML IV SOLN
INTRAVENOUS | Status: DC | PRN
  Administered 2017-10-28: 12 ug via INTRAVENOUS
  Administered 2017-10-28: 8 ug via INTRAVENOUS
  Administered 2017-10-28: 20 ug via INTRAVENOUS
  Administered 2017-10-28: 8 ug via INTRAVENOUS

## 2017-10-28 MED ORDER — CEFAZOLIN SODIUM-DEXTROSE 2-4 GM/100ML-% IV SOLN
2.0000 g | INTRAVENOUS | Status: AC
  Administered 2017-10-28: 2 g via INTRAVENOUS

## 2017-10-28 MED ORDER — METOCLOPRAMIDE HCL 5 MG PO TABS: 5.0000 mg | ORAL_TABLET | Freq: Three times a day (TID) | ORAL | Status: DC | PRN

## 2017-10-28 MED ORDER — SUCCINYLCHOLINE CHLORIDE 20 MG/ML IJ SOLN
INTRAMUSCULAR | Status: DC | PRN
  Administered 2017-10-28: 100 mg via INTRAVENOUS

## 2017-10-28 MED ORDER — LIDOCAINE HCL (CARDIAC) 20 MG/ML IV SOLN
INTRAVENOUS | Status: DC | PRN
  Administered 2017-10-28: 80 mg via INTRATRACHEAL

## 2017-10-28 MED ORDER — DEXMEDETOMIDINE HCL IN NACL 200 MCG/50ML IV SOLN
INTRAVENOUS | Status: AC
  Filled 2017-10-28: qty 50

## 2017-10-28 MED ORDER — FLEET ENEMA 7-19 GM/118ML RE ENEM: 1.0000 | ENEMA | Freq: Once | RECTAL | Status: DC | PRN

## 2017-10-28 MED ORDER — OXYCODONE HCL 5 MG/5ML PO SOLN: 5.0000 mg | Freq: Once | ORAL | Status: DC | PRN

## 2017-10-28 MED ORDER — ACETAMINOPHEN 325 MG PO TABS
650.0000 mg | ORAL_TABLET | ORAL | Status: DC | PRN
  Administered 2017-10-29: 650 mg via ORAL
  Filled 2017-10-28: qty 2

## 2017-10-28 MED ORDER — CELECOXIB 200 MG PO CAPS: 200.0000 mg | ORAL_CAPSULE | Freq: Two times a day (BID) | ORAL | 2 refills | Status: AC

## 2017-10-28 MED ORDER — CELECOXIB 200 MG PO CAPS
200.0000 mg | ORAL_CAPSULE | Freq: Two times a day (BID) | ORAL | Status: DC
  Administered 2017-10-28 – 2017-10-30 (×4): 200 mg via ORAL
  Filled 2017-10-28 (×4): qty 1

## 2017-10-28 MED ORDER — MIDAZOLAM HCL 5 MG/5ML IJ SOLN
INTRAMUSCULAR | Status: DC | PRN
  Administered 2017-10-28: 2 mg via INTRAVENOUS

## 2017-10-28 MED ORDER — ACETAMINOPHEN 325 MG PO TABS: 325.0000 mg | ORAL_TABLET | ORAL | Status: DC | PRN

## 2017-10-28 MED ORDER — METHOCARBAMOL 500 MG PO TABS
500.0000 mg | ORAL_TABLET | Freq: Four times a day (QID) | ORAL | Status: DC | PRN
  Administered 2017-10-28 – 2017-10-30 (×6): 500 mg via ORAL
  Filled 2017-10-28 (×6): qty 1

## 2017-10-28 MED ORDER — SODIUM CHLORIDE 0.9 % IR SOLN
Status: DC | PRN
  Administered 2017-10-28: 3000 mL

## 2017-10-28 MED ORDER — SUGAMMADEX SODIUM 200 MG/2ML IV SOLN
INTRAVENOUS | Status: DC | PRN
  Administered 2017-10-28: 200 mg via INTRAVENOUS

## 2017-10-28 MED ORDER — DIPHENHYDRAMINE HCL 50 MG/ML IJ SOLN
INTRAMUSCULAR | Status: AC
  Filled 2017-10-28: qty 1

## 2017-10-28 MED ORDER — CEFAZOLIN SODIUM-DEXTROSE 2-4 GM/100ML-% IV SOLN
INTRAVENOUS | Status: AC
  Filled 2017-10-28: qty 100

## 2017-10-28 MED ORDER — OXYCODONE HCL 5 MG PO TABS: 5.0000 mg | ORAL_TABLET | ORAL | Status: DC | PRN

## 2017-10-28 MED ORDER — ASPIRIN EC 325 MG PO TBEC: 325.0000 mg | DELAYED_RELEASE_TABLET | Freq: Two times a day (BID) | ORAL | 0 refills | Status: DC

## 2017-10-28 MED ORDER — BUPIVACAINE-EPINEPHRINE (PF) 0.25% -1:200000 IJ SOLN
INTRAMUSCULAR | Status: DC | PRN
  Administered 2017-10-28: 50 mL

## 2017-10-28 SURGICAL SUPPLY — 63 items
BANDAGE ESMARK 6X9 LF (GAUZE/BANDAGES/DRESSINGS) ×1 IMPLANT
BLADE SAG 18X100X1.27 (BLADE) ×5 IMPLANT
BLADE SAGITTAL 13X1.27X60 (BLADE) IMPLANT
BLADE SAGITTAL 13X1.27X60MM (BLADE)
BLADE SAW SGTL 13X75X1.27 (BLADE) IMPLANT
BLADE SAW SGTL 18X1.27X75 (BLADE) ×1 IMPLANT
BLADE SAW SGTL 18X1.27X75MM (BLADE) ×1
BNDG CMPR 9X6 STRL LF SNTH (GAUZE/BANDAGES/DRESSINGS) ×1
BNDG CMPR MED 10X6 ELC LF (GAUZE/BANDAGES/DRESSINGS) ×1
BNDG ELASTIC 6X10 VLCR STRL LF (GAUZE/BANDAGES/DRESSINGS) ×3 IMPLANT
BNDG ESMARK 6X9 LF (GAUZE/BANDAGES/DRESSINGS) ×3
BOWL SMART MIX CTS (DISPOSABLE) ×3 IMPLANT
CAPT KNEE TOTAL 3 ATTUNE ×2 IMPLANT
CEMENT HV SMART SET (Cement) ×6 IMPLANT
COVER SURGICAL LIGHT HANDLE (MISCELLANEOUS) ×3 IMPLANT
CUFF TOURNIQUET SINGLE 34IN LL (TOURNIQUET CUFF) ×3 IMPLANT
CUFF TOURNIQUET SINGLE 44IN (TOURNIQUET CUFF) IMPLANT
DRAPE EXTREMITY T 121X128X90 (DRAPE) ×3 IMPLANT
DRAPE U-SHAPE 47X51 STRL (DRAPES) ×3 IMPLANT
DRSG AQUACEL AG ADV 3.5X10 (GAUZE/BANDAGES/DRESSINGS) ×3 IMPLANT
DURAPREP 26ML APPLICATOR (WOUND CARE) ×3 IMPLANT
ELECT CAUTERY BLADE 6.4 (BLADE) ×2 IMPLANT
ELECT REM PT RETURN 9FT ADLT (ELECTROSURGICAL) ×3
ELECTRODE REM PT RTRN 9FT ADLT (ELECTROSURGICAL) ×1 IMPLANT
FACESHIELD WRAPAROUND (MASK) ×3 IMPLANT
FACESHIELD WRAPAROUND OR TEAM (MASK) IMPLANT
GLOVE BIO SURGEON STRL SZ 6.5 (GLOVE) ×3 IMPLANT
GLOVE BIO SURGEON STRL SZ7.5 (GLOVE) ×3 IMPLANT
GLOVE BIO SURGEON STRL SZ8.5 (GLOVE) ×3 IMPLANT
GLOVE BIO SURGEONS STRL SZ 6.5 (GLOVE) ×3
GLOVE BIOGEL PI IND STRL 6.5 (GLOVE) IMPLANT
GLOVE BIOGEL PI IND STRL 8 (GLOVE) ×1 IMPLANT
GLOVE BIOGEL PI IND STRL 9 (GLOVE) ×1 IMPLANT
GLOVE BIOGEL PI INDICATOR 6.5 (GLOVE) ×6
GLOVE BIOGEL PI INDICATOR 8 (GLOVE) ×2
GLOVE BIOGEL PI INDICATOR 9 (GLOVE) ×2
GOWN STRL REUS W/ TWL LRG LVL3 (GOWN DISPOSABLE) ×1 IMPLANT
GOWN STRL REUS W/ TWL XL LVL3 (GOWN DISPOSABLE) ×2 IMPLANT
GOWN STRL REUS W/TWL LRG LVL3 (GOWN DISPOSABLE) ×3
GOWN STRL REUS W/TWL XL LVL3 (GOWN DISPOSABLE) ×6
HANDPIECE INTERPULSE COAX TIP (DISPOSABLE) ×3
HOOD PEEL AWAY FACE SHEILD DIS (HOOD) ×6 IMPLANT
KIT BASIN OR (CUSTOM PROCEDURE TRAY) ×3 IMPLANT
KIT ROOM TURNOVER OR (KITS) ×3 IMPLANT
MANIFOLD NEPTUNE II (INSTRUMENTS) ×3 IMPLANT
NEEDLE 22X1 1/2 (OR ONLY) (NEEDLE) ×6 IMPLANT
NS IRRIG 1000ML POUR BTL (IV SOLUTION) ×3 IMPLANT
PACK TOTAL JOINT (CUSTOM PROCEDURE TRAY) ×3 IMPLANT
PAD ARMBOARD 7.5X6 YLW CONV (MISCELLANEOUS) ×6 IMPLANT
SET HNDPC FAN SPRY TIP SCT (DISPOSABLE) ×1 IMPLANT
SPONGE LAP 18X18 X RAY DECT (DISPOSABLE) ×2 IMPLANT
SUT VIC AB 0 CT1 27 (SUTURE) ×3
SUT VIC AB 0 CT1 27XBRD ANBCTR (SUTURE) ×1 IMPLANT
SUT VIC AB 1 CTX 36 (SUTURE) ×3
SUT VIC AB 1 CTX36XBRD ANBCTR (SUTURE) ×1 IMPLANT
SUT VIC AB 2-0 CT1 27 (SUTURE) ×3
SUT VIC AB 2-0 CT1 TAPERPNT 27 (SUTURE) ×1 IMPLANT
SUT VIC AB 3-0 CT1 27 (SUTURE) ×3
SUT VIC AB 3-0 CT1 TAPERPNT 27 (SUTURE) ×1 IMPLANT
SYR CONTROL 10ML LL (SYRINGE) ×6 IMPLANT
TOWEL OR 17X24 6PK STRL BLUE (TOWEL DISPOSABLE) ×3 IMPLANT
TOWEL OR 17X26 10 PK STRL BLUE (TOWEL DISPOSABLE) ×3 IMPLANT
TRAY CATH 16FR W/PLASTIC CATH (SET/KITS/TRAYS/PACK) ×2 IMPLANT

## 2017-10-28 NOTE — Transfer of Care (Signed)
Immediate Anesthesia Transfer of Care Note  Patient: Steven ConnersMichael R Read  Procedure(s) Performed: RIGHT TOTAL KNEE ARTHROPLASTY (Right Knee)  Patient Location: PACU  Anesthesia Type:GA combined with regional for post-op pain  Level of Consciousness: alert , oriented, sedated, drowsy and patient cooperative  Airway & Oxygen Therapy: Patient Spontanous Breathing and Patient connected to nasal cannula oxygen  Post-op Assessment: Report given to RN, Post -op Vital signs reviewed and stable and Patient moving all extremities X 4  Post vital signs: Reviewed and stable  Last Vitals:  Vitals:   10/28/17 1009 10/28/17 1014  BP:  (!) 168/96  Resp: 18   Temp: 36.8 C   SpO2: 100%     Last Pain:  Vitals:   10/28/17 1009  TempSrc: Oral      Patients Stated Pain Goal: 5 (10/28/17 1039)  Complications: No apparent anesthesia complications

## 2017-10-28 NOTE — Anesthesia Preprocedure Evaluation (Signed)
Anesthesia Evaluation  Patient identified by MRN, date of birth, ID band Patient awake    Reviewed: Allergy & Precautions, NPO status , Patient's Chart, lab work & pertinent test results  History of Anesthesia Complications Negative for: history of anesthetic complications  Airway Mallampati: II  TM Distance: >3 FB Neck ROM: Full    Dental  (+) Teeth Intact   Pulmonary neg pulmonary ROS,    breath sounds clear to auscultation       Cardiovascular negative cardio ROS   Rhythm:Regular     Neuro/Psych negative neurological ROS  negative psych ROS   GI/Hepatic negative GI ROS, Neg liver ROS,   Endo/Other  negative endocrine ROS  Renal/GU Renal InsufficiencyRenal disease     Musculoskeletal  (+) Arthritis ,   Abdominal   Peds  Hematology negative hematology ROS (+)   Anesthesia Other Findings   Reproductive/Obstetrics                             Anesthesia Physical Anesthesia Plan  ASA: II  Anesthesia Plan: General and Regional   Post-op Pain Management:  Regional for Post-op pain   Induction: Intravenous  PONV Risk Score and Plan: 2  Airway Management Planned: Oral ETT and LMA  Additional Equipment: None  Intra-op Plan:   Post-operative Plan: Extubation in OR  Informed Consent: I have reviewed the patients History and Physical, chart, labs and discussed the procedure including the risks, benefits and alternatives for the proposed anesthesia with the patient or authorized representative who has indicated his/her understanding and acceptance.   Dental advisory given  Plan Discussed with: CRNA and Trapani  Anesthesia Plan Comments:         Anesthesia Quick Evaluation

## 2017-10-28 NOTE — Progress Notes (Signed)
Patient dangled and stood up at bedside.  He tolerated change of positions very well without c/o dizziness.

## 2017-10-28 NOTE — Anesthesia Procedure Notes (Signed)
Anesthesia Regional Block: Adductor canal block   Pre-Anesthetic Checklist: ,, timeout performed, Correct Patient, Correct Site, Correct Laterality, Correct Procedure, Correct Position, site marked, Risks and benefits discussed,  Surgical consent,  Pre-op evaluation,  At Chakraborty's request and post-op pain management  Laterality: Lower and Right  Prep: chloraprep       Needles:  Injection technique: Single-shot  Needle Type: Echogenic Stimulator Needle          Additional Needles:   Procedures:,,,, ultrasound used (permanent image in chart),,,,  Narrative:  Start time: 10/28/2017 3:11 PM End time: 10/28/2017 3:16 PM Injection made incrementally with aspirations every 5 mL.  Performed by: Personally  Anesthesiologist: Val EagleMoser, Elleni Mozingo, MD  Additional Notes: H+P and labs reviewed, risks and benefits discussed with patient, procedure tolerated well without complications

## 2017-10-28 NOTE — Interval H&P Note (Signed)
History and Physical Interval Note:  10/28/2017 12:42 PM  Steven Ramsey  has presented today for surgery, with the diagnosis of RIGHT KNEE OSTEOARTHRITIS  The various methods of treatment have been discussed with the patient and family. After consideration of risks, benefits and other options for treatment, the patient has consented to  Procedure(s): RIGHT TOTAL KNEE ARTHROPLASTY (Right) as a surgical intervention .  The patient's history has been reviewed, patient examined, no change in status, stable for surgery.  I have reviewed the patient's chart and labs.  Questions were answered to the patient's satisfaction.     Nestor LewandowskyFrank J Silena Wyss

## 2017-10-28 NOTE — Anesthesia Procedure Notes (Signed)
Procedure Name: Intubation Date/Time: 10/28/2017 1:13 PM Performed by: Jed LimerickHarder, Marycarmen Hagey S, CRNA Pre-anesthesia Checklist: Patient identified, Emergency Drugs available, Suction available and Patient being monitored Patient Re-evaluated:Patient Re-evaluated prior to induction Oxygen Delivery Method: Circle System Utilized Preoxygenation: Pre-oxygenation with 100% oxygen Induction Type: IV induction Ventilation: Mask ventilation without difficulty Laryngoscope Size: Miller and 3 Grade View: Grade I Tube type: Oral Tube size: 8.0 mm Number of attempts: 1 Airway Equipment and Method: Stylet and Oral airway Placement Confirmation: ETT inserted through vocal cords under direct vision,  positive ETCO2 and breath sounds checked- equal and bilateral Tube secured with: Tape Dental Injury: Teeth and Oropharynx as per pre-operative assessment

## 2017-10-28 NOTE — Discharge Instructions (Signed)

## 2017-10-28 NOTE — Anesthesia Postprocedure Evaluation (Signed)
Anesthesia Post Note  Patient: Steven ConnersMichael R Ramsey  Procedure(s) Performed: RIGHT TOTAL KNEE ARTHROPLASTY (Right Knee)     Patient location during evaluation: PACU Anesthesia Type: Regional and General Level of consciousness: awake and alert Pain management: pain level controlled Vital Signs Assessment: post-procedure vital signs reviewed and stable Respiratory status: spontaneous breathing, nonlabored ventilation, respiratory function stable and patient connected to nasal cannula oxygen Cardiovascular status: blood pressure returned to baseline and stable Postop Assessment: no apparent nausea or vomiting Anesthetic complications: no    Last Vitals:  Vitals:   10/28/17 1730 10/28/17 1745  BP:    Pulse: 88 (!) 103  Resp:    Temp:    SpO2: 100% 100%    Last Pain:  Vitals:   10/28/17 1530  TempSrc:   PainSc: 0-No pain                 Charrie Mcconnon

## 2017-10-28 NOTE — Progress Notes (Signed)
Orthopedic Tech Progress Note Patient Details:  Steven Ramsey April 06, 1971 [id number]  Ortho Devices Type of Ortho Device: Bone foam zero knee Ortho Device/Splint Interventions: Casandra DoffingOrdered       Leonard Feigel Craig 10/28/2017, 4:57 PM

## 2017-10-28 NOTE — Op Note (Signed)
PATIENT ID:      Steven Ramsey  MRN:     [id number] DOB/AGE:    1971-04-25 / 47 y.o.       OPERATIVE REPORT    DATE OF PROCEDURE:  10/28/2017       PREOPERATIVE DIAGNOSIS:   RIGHT KNEE OSTEOARTHRITIS      Estimated body mass index is 33.84 kg/m as calculated from the following:   Height as of this encounter:  (1.803 m).   Weight as of this encounter: 242 lb 9.6 oz (110 kg).                                                        POSTOPERATIVE DIAGNOSIS:   RIGHT KNEE OSTEOARTHRITIS                                                                      PROCEDURE:  Procedure(s): RIGHT TOTAL KNEE ARTHROPLASTY Using DepuyAttune RP implants #7R Femur, #8Tibia, 6 mm Attune RP bearing, 41 Patella     Lichter: Steven Ramsey    ASSISTANT:   Tomi Likens. [employer]   (Present and scrubbed throughout the case, critical for assistance with exposure, retraction, instrumentation, and closure.)         ANESTHESIA: GET, 20cc Exparel, 50cc 0.25% Marcaine  EBL: 400cc  FLUID REPLACEMENT: 1500 crystalloid  TOURNIQUET TIME:  Drains: None  Tranexamic Acid: 1gm IV, 2gm topical  COMPLICATIONS:  None         INDICATIONS FOR PROCEDURE: The patient has  RIGHT KNEE OSTEOARTHRITIS, Var deformities, XR shows bone on bone arthritis, lateral subluxation of tibia. Patient has failed all conservative measures including anti-inflammatory medicines, narcotics, attempts at  exercise and weight loss, cortisone injections and viscosupplementation.  Risks and benefits of surgery have been discussed, questions answered.   DESCRIPTION OF PROCEDURE: The patient identified by armband, received  IV antibiotics, in the holding area at [facility]. Patient taken to the operating room, appropriate anesthetic  monitors were attached, and GET anesthesia was  induced. Tourniquet  applied high to the operative thigh. Lateral post and foot positioner  applied to the table, the lower extremity was then prepped and  draped  in usual sterile fashion from the toes to the tourniquet. Time-out procedure was performed. We began the operation, with the knee flexed 120 degrees, by making the anterior midline incision starting at handbreadth above the patella going over the patella 1 cm medial to and 4 cm distal to the tibial tubercle. Small bleeders in the skin and the  subcutaneous tissue identified and cauterized. Transverse retinaculum was incised and reflected medially and a medial parapatellar arthrotomy was accomplished. the patella was everted and theprepatellar fat pad resected. The superficial medial collateral  ligament was then elevated from anterior to posterior along the proximal  flare of the tibia and anterior half of the menisci resected. The knee was hyperflexed exposing bone on bone arthritis. Peripheral and notch osteophytes as well as the cruciate ligaments were then resected. We continued to  work our way around posteriorly  along the proximal tibia, and externally  rotated the tibia subluxing it out from underneath the femur. A McHale  retractor was placed through the notch and a lateral Hohmann retractor  placed, and we then drilled through the proximal tibia in line with the  axis of the tibia followed by an intramedullary guide rod and 2-degree  posterior slope cutting guide. The tibial cutting guide, 3 degree posterior sloped, was pinned into place allowing resection of 6 mm of bone medially and 8 mm of bone laterally. Satisfied with the tibial resection, we then  entered the distal femur 2 mm anterior to the PCL origin with the  intramedullary guide rod and applied the distal femoral cutting guide  set at 9 mm, with 5 degrees of valgus. This was pinned along the  epicondylar axis. At this point, the distal femoral cut was accomplished without difficulty. We then sized for a #7L femoral component and pinned the guide in 0 degrees of external rotation. The chamfer cutting guide was pinned into  place. The anterior, posterior, and chamfer cuts were accomplished without difficulty followed by  the Attune RP box cutting guide and the box cut. We also removed posterior osteophytes from the posterior femoral condyles. At this  time, the knee was brought into full extension. We checked our  extension and flexion gaps and found them symmetric for a 6 mm bearing. Distracting in extension with a lamina spreader, the posterior horns of the menisci were removed, and Exparel, diluted to 60 cc, with 20cc NS, and 20cc 0.5% Marcaine,was injected into the capsule and synovium of the knee. The posterior patella cut was accomplished with the 9.5 mm Attune cutting guide, sized for a 42mm dome, and the fixation pegs drilled.The knee  was then once again hyperflexed exposing the proximal tibia. We sized for a # 8 tibial base plate, applied the smokestack and the conical reamer followed by the the Delta fin keel punch. We then hammered into place the Attune RP trial femoral component, drilled the lugs, inserted a  6 mm trial bearing, trial patellar button, and took the knee through range of motion from 0-130 degrees. No thumb pressure was required for patellar Tracking. At this point, the limb was wrapped with an Esmarch bandage and the tourniquet inflated to 350 mmHg. All trial components were removed, mating surfaces irrigated with pulse lavage, and dried with suction and sponges. 10 cc of the Exparel solution was applied to the cancellus bone of the patella distal femur and proximal tibia.  After waiting 1 minute, the bony surfaces were again, dried with sponges. A double batch of DePuy HV cement with 1500 mg of Zinacef was mixed and applied to all bony metallic mating surfaces except for the posterior condyles of the femur itself. In order, we hammered into place the tibial tray and removed excess cement, the femoral component and removed excess cement. The final Attune RP bearing  was inserted, and the knee brought  to full extension with compression.  The patellar button was clamped into place, and excess cement  removed. While the cement cured the wound was irrigated out with normal saline solution pulse lavage. Ligament stability and patellar tracking were checked and found to be excellent. The parapatellar arthrotomy was closed with  running #1 Vicryl suture. The subcutaneous tissue with 0 and 2-0 undyed  Vicryl suture, and the skin with running 3-0 SQ vicryl. A dressing of Xeroform,  4 x 4, dressing sponges, Webril, and Ace wrap applied. The patient  awakened, and taken to recovery room without difficulty.   Steven Ramsey 10/28/2017, 2:39 PM

## 2017-10-29 ENCOUNTER — Encounter (HOSPITAL_COMMUNITY): Payer: Self-pay | Admitting: General Practice

## 2017-10-29 ENCOUNTER — Other Ambulatory Visit: Payer: Self-pay

## 2017-10-29 LAB — BASIC METABOLIC PANEL
Anion gap: 11 (ref 5–15)
BUN: 10 mg/dL (ref 6–20)
CALCIUM: 8.6 mg/dL — AB (ref 8.9–10.3)
CO2: 23 mmol/L (ref 22–32)
Chloride: 101 mmol/L (ref 101–111)
Creatinine, Ser: 1.19 mg/dL (ref 0.61–1.24)
GFR calc Af Amer: >60 mL/min (ref >60)
GFR calc non Af Amer: >60 mL/min (ref >60)
GLUCOSE: 121 mg/dL — AB (ref 65–99)
Potassium: 4 mmol/L (ref 3.5–5.1)
Sodium: 135 mmol/L (ref 135–145)

## 2017-10-29 LAB — CBC
HCT: 38.7 % — ABNORMAL LOW (ref 39.0–52.0)
Hemoglobin: 12.7 g/dL — ABNORMAL LOW (ref 13.0–17.0)
MCH: 24.4 pg — AB (ref 26.0–34.0)
MCHC: 32.8 g/dL (ref 30.0–36.0)
MCV: 74.3 fL — AB (ref 78.0–100.0)
PLATELETS: 100 10*3/uL — AB (ref 150–400)
RBC: 5.21 MIL/uL (ref 4.22–5.81)
RDW: 15.1 % (ref 11.5–15.5)
WBC: 9.4 10*3/uL (ref 4.0–10.5)

## 2017-10-29 NOTE — Progress Notes (Addendum)
Physical Therapy Treatment Patient Details Name: Steven Ramsey MRN: [id number] DOB: 1971-01-08 Today's Date: 10/29/2017    History of Present Illness 47 y.o. male admitted to [facility] on 10/28/17 for elective R TKA.  Pt with significant PMH of asthma, L wrist surgery, and R shoulder arthroscopy.     PT Comments    Pt is POD #1 and is moving and feeling much better this PM.  He was able to walk further into the hallway and continue his HEP.  He will need to practice stairs in the AM, but I anticipate he could d/c home after his AM session from a mobility standpoint.     Follow Up Recommendations  DC plan and follow up therapy as arranged by [facility]     Equipment Recommendations  None recommended by PT(already has RW and shower chair delivered to home)    Recommendations for Other Services   NA     Precautions / Restrictions Precautions Precautions: Knee Precaution Booklet Issued: Yes (comment) Precaution Comments: Knee exercise handout given and no pillow under surgical knee rule reviewed.  Restrictions RLE Weight Bearing: Weight bearing as tolerated    Mobility  Bed Mobility Overal bed mobility: Modified Independent                Transfers Overall transfer level: Needs assistance Equipment used: Rolling walker (2 wheeled) Transfers: Sit to/from Stand Sit to Stand: Supervision         General transfer comment: supervision for safety. Pt lightheaded in standing, advised him to stand for a minute before proceeding to walk.   Ambulation/Gait Ambulation/Gait assistance: Supervision Ambulation Distance (Feet): 200 Feet Assistive device: Rolling walker (2 wheeled) Gait Pattern/deviations: Step-to pattern;Antalgic Gait velocity: decreased Gait velocity interpretation: Below normal speed for age/gender General Gait Details: Pt felt much better on his feet this PM, supervision for safety during gait, safe technique.    Stairs Stairs: (verbally discussed stairs and  sequencing, but did not pract.)                 Balance Overall balance assessment: Needs assistance Sitting-balance support: Feet supported;No upper extremity supported Sitting balance-Leahy Scale: Good     Standing balance support: No upper extremity supported Standing balance-Leahy Scale: Fair                              Cognition Arousal/Alertness: Awake/alert Behavior During Therapy: WFL for tasks assessed/performed Overall Cognitive Status: Within Functional Limits for tasks assessed                                        Exercises Total Joint Exercises  Quad Sets: AROM;Right;10 reps Heel Slides: AAROM;Right;10 reps(pt used leg loop (blanket) to assist himself) Hip ABduction/ADduction: AROM Straight Leg Raises: AROM SAQ: AROM R 10 reps    General Comments General comments (skin integrity, edema, etc.): Despite extreme lightheadedness in standing, walking and even in the recliner chair, BP/HR/O2 sats were stable.        Pertinent Vitals/Pain Pain Assessment: 0-10 Pain Score: 7  Pain Location: right knee Pain Descriptors / Indicators: Aching;Burning Pain Intervention(s): Limited activity within patient's tolerance;Monitored during session;Repositioned;Ice applied    Home Living Family/patient expects to be discharged to:: Private residence Living Arrangements: Spouse/significant other Available Help at Discharge: Family;Available 24 hours/day Type of Home: House Home Access: Stairs to enter Entrance  Stairs-Rails: Right Home Layout: Two level Home Equipment: Walker - 2 wheels;Bedside commode;Cane - single point      Prior Function Level of Independence: Needs assistance  Gait / Transfers Assistance Needed: ambulated with cane       PT Goals (current goals can now be found in the care plan section) Acute Rehab PT Goals Patient Stated Goal: pain free walking and working out PT Goal Formulation: With patient Time For Goal  Achievement: 11/05/17 Potential to Achieve Goals: Good Progress towards PT goals: Progressing toward goals    Frequency    7X/week      PT Plan Current plan remains appropriate       AM-PAC PT "6 Clicks" Daily Activity  Outcome Measure  Difficulty turning over in bed (including adjusting bedclothes, sheets and blankets)?: None Difficulty moving from lying on back to sitting on the side of the bed? : None Difficulty sitting down on and standing up from a chair with arms (e.g., wheelchair, bedside commode, etc,.)?: None Help needed moving to and from a bed to chair (including a wheelchair)?: None Help needed walking in hospital room?: A Little Help needed climbing 3-5 steps with a railing? : None 6 Click Score: 23    End of Session   Activity Tolerance: Patient limited by pain Patient left: in bed;with call bell/phone within reach;with family/visitor present Nurse Communication: Mobility status PT Visit Diagnosis: Muscle weakness (generalized) (M62.81);Difficulty in walking, not elsewhere classified (R26.2);Pain Pain - Right/Left: Right Pain - part of body: Knee     Time: [id number] PT Time Calculation (min) (ACUTE ONLY): 34 min  Charges:  $Gait Training: 8-22 mins $Therapeutic Exercise: 8-22 mins          Rexford Prevo B. Abiageal Blowe, PT, DPT [phone]            10/29/2017, 4:41 PM

## 2017-10-29 NOTE — Care Management Note (Signed)
Case Management Note  Patient Details  Name: Steven Ramsey: [id number] Date of Birth: 1971/04/16  Subjective/Objective:  47 yr old gentleman s/p right total knee arthroplasty.                   Action/Plan: Case manager spoke with patient concerning discharge plan and DME. He is under worker's Comp. His Nurse Case Manager is [employer] EnergySheryl Long [facility] [address] [facility] [phone].   Expected Discharge Date:    10/30/17              Expected Discharge Plan:  Home w Home Health Services  In-House Referral:  NA  Discharge planning Services  CM Consult  Post Acute Care Choice:  Home Health Choice offered to:  Patient  DME Arranged:  N/A(Has rolling walker and shower chair) 3in1 DME Agency:  (worker's comp arranged for DME)  [facility]   HH Arranged:  PT HH Agency:   [facility]  Status of Service:  Completed If discussed at Long Length of Stay Meetings, dates discussed:    Additional Comments:  10/30/17 Case manager contacted Thomasena EdisSheryl Long, RN CM  With [employer] , she has contacted [facility] with authorization for 3in1. [facility] Case Manager faxing orders, OP note, demographics to them at [phone], CM confirmed that patient is assigned to [facility].     Durenda GuthrieBrady, Rejina Odle Naomi, RN 10/29/2017, 12:16 PM

## 2017-10-29 NOTE — Progress Notes (Signed)
PATIENT ID: Suszanne ConnersMichael R Benevides  MRN: [id number]  DOB/AGE:  [birth year] / 47 y.o.  1 Day Post-Op Procedure(s) (LRB): RIGHT TOTAL KNEE ARTHROPLASTY (Right)    PROGRESS NOTE Subjective: Patient is alert, oriented, no Nausea, no Vomiting, yes passing gas. Taking PO well. Denies SOB, Chest or Calf Pain. Using Incentive Spirometer, PAS in place. Ambulate WBAT, Patient reports pain as 5/10 .    Objective: Vital signs in last 24 hours: Vitals:   10/28/17 1830 10/28/17 2100 10/28/17 2356 10/29/17 0352  BP: (!) 152/80 140/84 136/77 135/75  Pulse: 96 92 86 93  Resp: 18 18 18 18   Temp: 98.6 F (37 C) 98.2 F (36.8 C) 98.8 F (37.1 C) 98.8 F (37.1 C)  TempSrc: Oral Oral Oral Oral  SpO2: 99% 96% 98% 99%  Weight:      Height:          Intake/Output from previous day: I/O last 3 completed shifts: In: 1800 [I.V.:1800] Out: 500 [Urine:100; Blood:400]   Intake/Output this shift: Total I/O In: -  Out: 1300 [Urine:1300]   LABORATORY DATA: No results for input(s): WBC, HGB, HCT, PLT, NA, K, CL, CO2, BUN, CREATININE, GLUCOSE, GLUCAP, INR, CALCIUM in the last 72 hours.  Invalid input(s): PT, 2  Examination: Neurologically intact ABD soft Neurovascular intact Sensation intact distally Intact pulses distally Dorsiflexion/Plantar flexion intact Incision: dressing C/D/I No cellulitis present Compartment soft}  Assessment:   1 Day Post-Op Procedure(s) (LRB): RIGHT TOTAL KNEE ARTHROPLASTY (Right) ADDITIONAL DIAGNOSIS: Expected Acute Blood Loss Anemia,   Plan: PT/OT WBAT, AROM and PROM  DVT Prophylaxis:  SCDx72hrs, ASA 325 mg BID x 2 weeks DISCHARGE PLAN: Home, when passes PT DISCHARGE NEEDS: HHPT, Walker and 3-in-1 comode seat     Nestor LewandowskyFrank J Onyx Edgley 10/29/2017, 6:53 AM

## 2017-10-29 NOTE — Evaluation (Addendum)
Physical Therapy Evaluation Patient Details Name: Steven ConnersMichael R Cosma MRN: [id number] DOB: November 06, 1970 Today's Date: 10/29/2017   History of Present Illness  47 y.o. [facility] for elective R TKA.  Pt with significant PMH of asthma, L wrist surgery, and R shoulder arthroscopy.   Clinical Impression  Pt is POD #1 and pt is moving well, min guard assist overall, however, he was significantly limited by nausea and lightheadedness. Vitals checked and were stable.  Pt will likely not be ready to go home today, but if he feels better, likely tomorrow.   PT to follow acutely for deficits listed below.      10/29/17 1217  Vital Signs  Pulse Rate 66  BP (!) 146/80  BP Location Right Arm  BP Method Automatic  Patient Position (if appropriate) Lying (reclined fully in recliner chair)  Oxygen Therapy  SpO2 100 %  O2 Device Room Air     Follow Up Recommendations DC plan and follow up therapy as arranged by [facility]    Equipment Recommendations  None recommended by PT(already has RW and shower chair delivered to home)    Recommendations for Other Services   NA    Precautions / Restrictions Precautions Precautions: Knee Precaution Booklet Issued: Yes (comment) Precaution Comments: Knee exercise handout given and no pillow under surgical knee rule reviewed.  Restrictions RLE Weight Bearing: Weight bearing as tolerated      Mobility  Bed Mobility Overal bed mobility: Modified Independent                Transfers Overall transfer level: Needs assistance Equipment used: Rolling walker (2 wheeled) Transfers: Sit to/from Stand Sit to Stand: Supervision         General transfer comment: supervision for safety. Pt lightheaded in standing, advised him to stand for a minute before proceeding to walk.   Ambulation/Gait Ambulation/Gait assistance: Min guard Ambulation Distance (Feet): 75 Feet Assistive device: Rolling walker (2 wheeled) Gait  Pattern/deviations: Step-to pattern;Antalgic Gait velocity: decreased Gait velocity interpretation: Below normal speed for age/gender General Gait Details: Verbal cues for correct LE sequencing and RW use.  Pt reported continued lightheadedness and nausea as we walked further which limited his gait distance.          Balance Overall balance assessment: Needs assistance Sitting-balance support: Feet supported;No upper extremity supported Sitting balance-Leahy Scale: Good     Standing balance support: No upper extremity supported Standing balance-Leahy Scale: Fair                               Pertinent Vitals/Pain Pain Assessment: 0-10 Pain Score: 6  Pain Location: right knee Pain Descriptors / Indicators: Aching;Burning Pain Intervention(s): Limited activity within patient's tolerance;Monitored during session;Repositioned    Home Living Family/patient expects to be discharged to:: Private residence Living Arrangements: Spouse/significant other Available Help at Discharge: Family;Available 24 hours/day Type of Home: House Home Access: Stairs to enter Entrance Stairs-Rails: Right Entrance Stairs-Number of Steps: 6 Home Layout: Two level Home Equipment: Walker - 2 wheels;Bedside commode;Cane - single point      Prior Function Level of Independence: Needs assistance   Gait / Transfers Assistance Needed: ambulated with cane           Hand Dominance   Dominant Hand: Right    Extremity/Trunk Assessment   Upper Extremity Assessment Upper Extremity Assessment: Overall WFL for tasks assessed    Lower Extremity Assessment Lower Extremity Assessment:  RLE deficits/detail RLE Deficits / Details: right leg with normal post op pain and weakness.  Ankle at least 3/5, knee 2+/5, hip flexion 3-/5    Cervical / Trunk Assessment Cervical / Trunk Assessment: Normal  Communication   Communication: No difficulties  Cognition Arousal/Alertness:  Awake/alert Behavior During Therapy: WFL for tasks assessed/performed Overall Cognitive Status: Within Functional Limits for tasks assessed                                        General Comments General comments (skin integrity, edema, etc.): Despite extreme lightheadedness in standing, walking and even in the recliner chair, BP/HR/O2 sats were stable.      Exercises Total Joint Exercises Ankle Circles/Pumps: AROM;Both;20 reps Quad Sets: AROM;Right;10 reps Towel Squeeze: AROM;Both;10 reps Heel Slides: AAROM;Right;10 reps Goniometric ROM: 7-100   Assessment/Plan    PT Assessment Patient needs continued PT services  PT Problem List Decreased strength;Decreased range of motion;Decreased activity tolerance;Decreased balance;Decreased mobility;Decreased knowledge of use of DME;Decreased knowledge of precautions;Pain       PT Treatment Interventions DME instruction;Gait training;Stair training;Functional mobility training;Therapeutic activities;Therapeutic exercise;Balance training;Patient/family education;Manual techniques;Modalities    PT Goals (Current goals can be found in the Care Plan section)  Acute Rehab PT Goals Patient Stated Goal: pain free walking and working out PT Goal Formulation: With patient Time For Goal Achievement: 11/05/17 Potential to Achieve Goals: Good    Frequency 7X/week           AM-PAC PT "6 Clicks" Daily Activity  Outcome Measure Difficulty turning over in bed (including adjusting bedclothes, sheets and blankets)?: None Difficulty moving from lying on back to sitting on the side of the bed? : None Difficulty sitting down on and standing up from a chair with arms (e.g., wheelchair, bedside commode, etc,.)?: None Help needed moving to and from a bed to chair (including a wheelchair)?: A Little Help needed walking in hospital room?: A Little Help needed climbing 3-5 steps with a railing? : A Little 6 Click Score: 21    End of  Session   Activity Tolerance: Patient limited by fatigue;Patient limited by pain;Other (comment)(limited by lightheadedness and nausea) Patient left: in bed;with call bell/phone within reach Nurse Communication: Mobility status PT Visit Diagnosis: Muscle weakness (generalized) (M62.81);Difficulty in walking, not elsewhere classified (R26.2);Pain Pain - Right/Left: Right Pain - part of body: Knee    Time: 4098-1191 PT Time Calculation (min) (ACUTE ONLY): 41 min   Charges:       Lurena Joiner B. Stellan Vick, PT, DPT [phone]   PT Evaluation $PT Eval Low Complexity: 1 Low PT Treatments $Gait Training: 8-22 mins $Therapeutic Exercise: 8-22 mins   10/29/2017, 3:07 PM

## 2017-10-29 NOTE — Plan of Care (Signed)
  Progressing Education: Knowledge of General Education information will improve 10/29/2017 1034 - Progressing by Quentin CornwallMadison, Ziaire Bieser, RN Health Behavior/Discharge Planning: Ability to manage health-related needs will improve 10/29/2017 1034 - Progressing by Quentin CornwallMadison, Zonya Gudger, RN Clinical Measurements: Ability to maintain clinical measurements within normal limits will improve 10/29/2017 1034 - Progressing by Quentin CornwallMadison, Keats Kingry, RN Will remain free from infection 10/29/2017 1034 - Progressing by Quentin CornwallMadison, Kirk Sampley, RN Diagnostic test results will improve 10/29/2017 1034 - Progressing by Quentin CornwallMadison, Fredricka Kohrs, RN Respiratory complications will improve 10/29/2017 1034 - Progressing by Quentin CornwallMadison, Kemyra August, RN Cardiovascular complication will be avoided 10/29/2017 1034 - Progressing by Quentin CornwallMadison, Beila Purdie, RN Activity: Risk for activity intolerance will decrease 10/29/2017 1034 - Progressing by Quentin CornwallMadison, Jeralynn Vaquera, RN Nutrition: Adequate nutrition will be maintained 10/29/2017 1034 - Progressing by Quentin CornwallMadison, Tanvi Gatling, RN Coping: Level of anxiety will decrease 10/29/2017 1034 - Progressing by Quentin CornwallMadison, Reily Treloar, RN Elimination: Will not experience complications related to bowel motility 10/29/2017 1034 - Progressing by Quentin CornwallMadison, Sehar Sedano, RN Will not experience complications related to urinary retention 10/29/2017 1034 - Progressing by Quentin CornwallMadison, Labrian Torregrossa, RN Pain Managment: General experience of comfort will improve 10/29/2017 1034 - Progressing by Quentin CornwallMadison, Nola Botkins, RN Safety: Ability to remain free from injury will improve 10/29/2017 1034 - Progressing by Quentin CornwallMadison, Chace Bisch, RN Skin Integrity: Risk for impaired skin integrity will decrease 10/29/2017 1034 - Progressing by Quentin CornwallMadison, Jovanna Hodges, RN

## 2017-10-30 LAB — CBC
HCT: 39 % (ref 39.0–52.0)
HEMOGLOBIN: 12.7 g/dL — AB (ref 13.0–17.0)
MCH: 24.1 pg — ABNORMAL LOW (ref 26.0–34.0)
MCHC: 32.6 g/dL (ref 30.0–36.0)
MCV: 74.1 fL — ABNORMAL LOW (ref 78.0–100.0)
Platelets: 93 10*3/uL — ABNORMAL LOW (ref 150–400)
RBC: 5.26 MIL/uL (ref 4.22–5.81)
RDW: 14.8 % (ref 11.5–15.5)
WBC: 8.2 10*3/uL (ref 4.0–10.5)

## 2017-10-30 NOTE — Progress Notes (Signed)
Physical Therapy Treatment Patient Details Name: Steven Ramsey Bais MRN: [id number] DOB: [birth year] Today's Date: 10/30/2017    History of Present Illness 47 y.o. male admitted to [facility] on 10/28/17 for elective R TKA.  Pt with significant PMH of asthma, L wrist surgery, and R shoulder arthroscopy.     PT Comments    Pt is POD #2 and continues to be limited by nausea.  I am worried as his knee flexion ROM is decreasing instead of increasing as we go.  We completed HEP program and pt is at a safe mobility level to d/c home.  PT to follow acutely until d/c confirmed.      Follow Up Recommendations  DC plan and follow up therapy as arranged by Sherard     Equipment Recommendations  3in1 (PT)    Recommendations for Other Services   NA     Precautions / Restrictions Precautions Precautions: Knee Restrictions RLE Weight Bearing: Weight bearing as tolerated    Mobility  Bed Mobility Overal bed mobility: Modified Independent             General bed mobility comments: slow  Transfers Overall transfer level: Modified independent Equipment used: Rolling walker (2 wheeled) Transfers: Sit to/from Stand Sit to Stand: Modified independent (Device/Increase time)            Ambulation/Gait Ambulation/Gait assistance: Modified independent (Device/Increase time) Ambulation Distance (Feet): 130 Feet Assistive device: Rolling walker (2 wheeled) Gait Pattern/deviations: Step-through pattern;Antalgic Gait velocity: decreased Gait velocity interpretation: Below normal speed for age/gender General Gait Details: Moderately antalgic gait patten, but less stiff legged than this AM.  He continues to be nauseated, but did not eat lunch, so is taking pain meds on nearly empty stomach.    Stairs Stairs: Yes   Stair Management: One rail Right;Step to pattern;Sideways Number of Stairs: 10 General stair comments: sideways with right rail simlating both home entry and his flight of stairs to  get up to his bedroom.  Verbal cues for LE sequencing and safety with spacing of feet.          Balance Overall balance assessment: Needs assistance Sitting-balance support: Feet supported;No upper extremity supported Sitting balance-Leahy Scale: Good     Standing balance support: No upper extremity supported Standing balance-Leahy Scale: Fair                              Cognition Arousal/Alertness: Awake/alert Behavior During Therapy: WFL for tasks assessed/performed Overall Cognitive Status: Within Functional Limits for tasks assessed                                        Exercises Total Joint Exercises Ankle Circles/Pumps: AROM;Both;20 reps Quad Sets: AROM;Right;10 reps Towel Squeeze: AROM;Both;10 reps Heel Slides: AAROM;Right;10 reps Long Arc Quad: AROM;Right;10 reps Knee Flexion: AROM;AAROM;Right;20 reps Goniometric ROM: 10-90    General Comments General comments (skin integrity, edema, etc.): Nauseated again this AM after taking pain meds even with food on his stomach.        Pertinent Vitals/Pain Pain Assessment: 0-10 Pain Score: 6  Pain Location: right knee Pain Descriptors / Indicators: Aching;Burning Pain Intervention(s): Limited activity within patient's tolerance;Monitored during session;Repositioned           PT Goals (current goals can now be found in the care plan section) Acute Rehab PT Goals Patient Stated Goal:  pain free walking and working out Progress towards PT goals: Progressing toward goals    Frequency    7X/week      PT Plan Current plan remains appropriate       AM-PAC PT "6 Clicks" Daily Activity  Outcome Measure  Difficulty turning over in bed (including adjusting bedclothes, sheets and blankets)?: None Difficulty moving from lying on back to sitting on the side of the bed? : None Difficulty sitting down on and standing up from a chair with arms (e.g., wheelchair, bedside commode, etc,.)?:  None Help needed moving to and from a bed to chair (including a wheelchair)?: None Help needed walking in hospital room?: None Help needed climbing 3-5 steps with a railing? : A Little 6 Click Score: 23    End of Session   Activity Tolerance: Patient limited by pain;Other (comment)(limited by nausea) Patient left: in bed;with call bell/phone within reach Nurse Communication: Mobility status;Other (comment)(done with PT, wife is on her way) PT Visit Diagnosis: Muscle weakness (generalized) (M62.81);Difficulty in walking, not elsewhere classified (R26.2);Pain Pain - Right/Left: Right Pain - part of body: Knee     Time: 1409-1430 PT Time Calculation (min) (ACUTE ONLY): 21 min  Charges:  $Gait Training: 8-22 mins $Therapeutic Exercise: 8-22 mins          Bulah Lurie B. Freddi Forster, PT, DPT [phone]            10/30/2017, 2:34 PM

## 2017-10-30 NOTE — Progress Notes (Signed)
Physical Therapy Treatment Patient Details Name: Steven Ramsey MRN: [id number] DOB: 03/13/1971 Today's Date: 10/30/2017    History of Present Illness 47 y.o. male admitted to [facility] on 10/28/17 for elective R TKA.  Pt with significant PMH of asthma, L wrist surgery, and R shoulder arthroscopy.     PT Comments    Pt has nausea this AM reporting he thinks it is from the pain medicine.  Nearly vomited at the end of our walk.  Pt was able to practice stairs and preform HEP.  ROM knee flexion less than yesterday due to increased soreness.  PT will see him again early afternoon prior to d/c to finish reviewing HEP and reviewing the stairs again.     Follow Up Recommendations  DC plan and follow up therapy as arranged by [name]     Equipment Recommendations  3in1 (PT)    Recommendations for Other Services   NA     Precautions / Restrictions Precautions Precautions: Knee Restrictions Weight Bearing Restrictions: Yes RLE Weight Bearing: Weight bearing as tolerated    Mobility  Bed Mobility Overal bed mobility: Modified Independent             General bed mobility comments: slow  Transfers Overall transfer level: Modified independent Equipment used: Rolling walker (2 wheeled) Transfers: Sit to/from Stand Sit to Stand: Modified independent (Device/Increase time)            Ambulation/Gait Ambulation/Gait assistance: Modified independent (Device/Increase time) Ambulation Distance (Feet): 150 Feet Assistive device: Rolling walker (2 wheeled) Gait Pattern/deviations: Step-through pattern;Antalgic Gait velocity: decreased Gait velocity interpretation: Below normal speed for age/gender General Gait Details: Pt with stiff legged gait pattern, moderately antalgic with decreased knee flexion during swing.  Pt is safe using RW, but still pretty heavy handed on the walker. Nauseated again this AM, but able to continue.  He reports the pain meds are making him nauseated.     Stairs Stairs: Yes   Stair Management: One rail Right;Step to pattern;Sideways Number of Stairs: 10 General stair comments: sideways with right rail simlating both home entry and his flight of stairs to get up to his bedroom.  Verbal cues for LE sequencing and safety with spacing of feet.          Balance Overall balance assessment: Needs assistance Sitting-balance support: Feet supported;No upper extremity supported Sitting balance-Leahy Scale: Good     Standing balance support: No upper extremity supported Standing balance-Leahy Scale: Fair                              Cognition Arousal/Alertness: Awake/alert Behavior During Therapy: WFL for tasks assessed/performed Overall Cognitive Status: Within Functional Limits for tasks assessed                                        Exercises Total Joint Exercises Ankle Circles/Pumps: AROM;Both;20 reps Quad Sets: AROM;Right;10 reps Towel Squeeze: AROM;Both;10 reps Heel Slides: AAROM;Right;10 reps Goniometric ROM: 10-90    General Comments General comments (skin integrity, edema, etc.): Nauseated again this AM after taking pain meds even with food on his stomach.        Pertinent Vitals/Pain Pain Assessment: 0-10 Pain Score: 8  Pain Location: right knee Pain Descriptors / Indicators: Aching;Burning Pain Intervention(s): Limited activity within patient's tolerance;Monitored during session;Repositioned;Ice applied  PT Goals (current goals can now be found in the care plan section) Acute Rehab PT Goals Patient Stated Goal: pain free walking and working out Progress towards PT goals: Progressing toward goals    Frequency    7X/week      PT Plan Current plan remains appropriate       AM-PAC PT "6 Clicks" Daily Activity  Outcome Measure  Difficulty turning over in bed (including adjusting bedclothes, sheets and blankets)?: None Difficulty moving from lying on back to  sitting on the side of the bed? : None Difficulty sitting down on and standing up from a chair with arms (e.g., wheelchair, bedside commode, etc,.)?: None Help needed moving to and from a bed to chair (including a wheelchair)?: None Help needed walking in hospital room?: A Little Help needed climbing 3-5 steps with a railing? : A Little 6 Click Score: 22    End of Session   Activity Tolerance: Patient limited by pain;Other (comment)(limited by nausea) Patient left: in bed;with call bell/phone within reach   PT Visit Diagnosis: Muscle weakness (generalized) (M62.81);Difficulty in walking, not elsewhere classified (R26.2);Pain Pain - Right/Left: Right Pain - part of body: Knee     Time: 1018-1100 PT Time Calculation (min) (ACUTE ONLY): 42 min  Charges:  $Gait Training: 23-37 mins $Therapeutic Exercise: 8-22 mins          Gaylyn Berish B. Shakema Surita, PT, DPT [phone]            10/30/2017, 11:43 AM

## 2017-10-30 NOTE — Discharge Summary (Signed)
Patient ID: Steven Ramsey MRN: [id number] DOB/AGE: 48-Dec-1972 48 y.o.  Admit date: 10/28/2017 Discharge date: 10/30/2017  Admission Diagnoses:  Principal Problem:   Osteoarthritis of right knee Active Problems:   Primary osteoarthritis of right knee   Discharge Diagnoses:  Same  Past Medical History:  Diagnosis Date  . Arthritis   . Asthma     Surgeries: Procedure(s): RIGHT TOTAL KNEE ARTHROPLASTY on 10/28/2017   Consultants:   Discharged Condition: Improved  Hospital Course: Steven Ramsey is an 47 y.o. male who was admitted 10/28/2017 for operative treatment ofOsteoarthritis of right knee. Patient has severe unremitting pain that affects sleep, daily activities, and work/hobbies. After pre-op clearance the patient was taken to the operating room on 10/28/2017 and underwent  Procedure(s): RIGHT TOTAL KNEE ARTHROPLASTY.    Patient was given perioperative antibiotics:  Anti-infectives (From admission, onward)   Start     Dose/Rate Route Frequency Ordered Stop   10/28/17 1011  ceFAZolin (ANCEF) IVPB 2g/100 mL premix     2 g 200 mL/hr over 30 Minutes Intravenous On call to O.R. 10/28/17 1011 10/28/17 1314   10/28/17 1006  ceFAZolin (ANCEF) 2-4 GM/100ML-% IVPB    Comments:  Posey Pronto   : cabinet override      10/28/17 1006 10/28/17 1314       Patient was given sequential compression devices, early ambulation, and chemoprophylaxis to prevent DVT.  Patient benefited maximally from hospital stay and there were no complications.    Recent vital signs:  Patient Vitals for the past 24 hrs:  BP Temp Temp src Pulse Resp SpO2  10/30/17 0542 140/81 98 F (36.7 C) Oral 82 17 99 %  10/29/17 2153 139/80 98.6 F (37 C) Oral 86 18 98 %  10/29/17 1503 132/70 98.7 F (37.1 C) Oral 79 17 98 %  10/29/17 1217 (!) 146/80 - - 66 - 100 %     Recent laboratory studies:  Recent Labs    10/29/17 0749 10/30/17 0416  WBC 9.4 8.2  HGB 12.7* 12.7*  HCT 38.7* 39.0  PLT 100*  93*  NA 135  --   K 4.0  --   CL 101  --   CO2 23  --   BUN 10  --   CREATININE 1.19  --   GLUCOSE 121*  --   CALCIUM 8.6*  --      Discharge Medications:   Allergies as of 10/30/2017   No Known Allergies     Medication List    STOP taking these medications   cyclobenzaprine 5 MG tablet Commonly known as:  FLEXERIL   HYDROcodone-acetaminophen 5-325 MG tablet Commonly known as:  NORCO/VICODIN   ibuprofen 200 MG tablet Commonly known as:  ADVIL,MOTRIN     TAKE these medications   aspirin EC 325 MG tablet Take 1 tablet (325 mg total) by mouth 2 (two) times daily.   celecoxib 200 MG capsule Commonly known as:  CELEBREX Take 1 capsule (200 mg total) by mouth 2 (two) times daily.   gabapentin 300 MG capsule Commonly known as:  NEURONTIN Take 1 capsule (300 mg total) by mouth 3 (three) times daily.   methocarbamol 500 MG tablet Commonly known as:  ROBAXIN Take 1 tablet (500 mg total) by mouth 2 (two) times daily with a meal.   oxyCODONE-acetaminophen 5-325 MG tablet Commonly known as:  PERCOCET/ROXICET Take 1 tablet by mouth every 4 (four) hours as needed for severe pain.   PROAIR HFA 108 (90 Base) MCG/ACT  inhaler Generic drug:  albuterol INHALE 2 PUFFS EVERY 6 HOURS AS NEEDED FOR COUGH OR WHEEZE *MAY USE 2 PUFFS 10-20 PRIOR TO EXERCISE            Durable Medical Equipment  (From admission, onward)        Start     Ordered   10/28/17 1823  DME Walker rolling  Once    Question:  Patient needs a walker to treat with the following condition  Answer:  Status post right knee replacement   10/28/17 1822   10/28/17 1823  DME 3 n 1  Once     10/28/17 1822   10/28/17 1823  DME Bedside commode  Once    Question:  Patient needs a bedside commode to treat with the following condition  Answer:  Status post right knee replacement   10/28/17 1822      Diagnostic Studies: Dg Chest 2 View  Result Date: 10/22/2017 CLINICAL DATA:  Preoperative evaluation.  Right  knee surgery. EXAM: CHEST  2 VIEW COMPARISON:  Chest radiograph 03/04/2007. FINDINGS: Stable cardiac and mediastinal contours. No consolidative pulmonary opacities. No pleural effusion or pneumothorax. Thoracic spine degenerative changes. IMPRESSION: No acute cardiopulmonary process. Electronically Signed   By: Annia Beltrew  Davis M.D.   On: 10/22/2017 08:18    Disposition: 01-Home or Self Care  Discharge Instructions    Call MD / Call 911   Complete by:  As directed    If you experience chest pain or shortness of breath, CALL 911 and be transported to the hospital emergency room.  If you develope a fever above 101 F, pus (white drainage) or increased drainage or redness at the wound, or calf pain, call your [facility] office.   Constipation Prevention   Complete by:  As directed    Drink plenty of fluids.  Prune juice may be helpful.  You may use a stool softener, such as Colace (over the counter) 100 mg twice a day.  Use MiraLax (over the counter) for constipation as needed.   Diet - low sodium heart healthy   Complete by:  As directed    Driving restrictions   Complete by:  As directed    No driving for 2 weeks   Increase activity slowly as tolerated   Complete by:  As directed    Patient may shower   Complete by:  As directed    You may shower without a dressing once there is no drainage.  Do not wash over the wound.  If drainage remains, cover wound with plastic wrap and then shower.      Follow-up Information    Gean Birchwoodowan, Frank, MD Follow up in 2 week(s).   Specialty:  Orthopedic Surgery Contact information: [address] [place] [address] [phone]            Signed: Dannielle Burnric Phillips 10/30/2017, 7:47 AM

## 2017-10-30 NOTE — Progress Notes (Signed)
Pt discharged home with wife per order. All equipment and belongings sent with patient. AVS and scripts reviewed and given to patient. VSS. BP 134/88 (BP Location: Right Arm)   Pulse 88   Temp 99.5 F (37.5 C) (Oral)   Resp 17   Ht 5\' 11"  (1.803 m)   Wt 110 kg (242 lb 9.6 oz)   SpO2 99%   BMI 33.84 kg/m

## 2017-10-30 NOTE — Progress Notes (Signed)
PATIENT ID: Steven ConnersMichael R Ramsey  MRN: [id number]  DOB/AGE:  10-Jul-1971 / 47 y.o.  2 Days Post-Op Procedure(s) (LRB): RIGHT TOTAL KNEE ARTHROPLASTY (Right)    PROGRESS NOTE Subjective: Patient is alert, oriented, no Nausea, no Vomiting, yes passing gas. Taking PO well. Denies SOB, Chest or Calf Pain. Using Incentive Spirometer, PAS in place. Ambulate WBAT with pt walking 200 ft with therapy, Patient reports pain as 7/10 .    Objective: Vital signs in last 24 hours: Vitals:   10/29/17 1217 10/29/17 1503 10/29/17 2153 10/30/17 0542  BP: (!) 146/80 132/70 139/80 140/81  Pulse: 66 79 86 82  Resp:  17 18 17   Temp:  98.7 F (37.1 C) 98.6 F (37 C) 98 F (36.7 C)  TempSrc:  Oral Oral Oral  SpO2: 100% 98% 98% 99%  Weight:      Height:          Intake/Output from previous day: I/O last 3 completed shifts: In: 960 [P.O.:960] Out: 3000 [Urine:3000]   Intake/Output this shift: No intake/output data recorded.   LABORATORY DATA: Recent Labs    10/29/17 0749 10/30/17 0416  WBC 9.4 8.2  HGB 12.7* 12.7*  HCT 38.7* 39.0  PLT 100* 93*  NA 135  --   K 4.0  --   CL 101  --   CO2 23  --   BUN 10  --   CREATININE 1.19  --   GLUCOSE 121*  --   CALCIUM 8.6*  --     Examination: Neurologically intact Neurovascular intact Sensation intact distally Intact pulses distally Dorsiflexion/Plantar flexion intact Incision: dressing C/D/I and no drainage No cellulitis present Compartment soft}  Assessment:   2 Days Post-Op Procedure(s) (LRB): RIGHT TOTAL KNEE ARTHROPLASTY (Right) ADDITIONAL DIAGNOSIS: Expected Acute Blood Loss Anemia,   Plan: PT/OT WBAT, AROM and PROM  DVT Prophylaxis:  SCDx72hrs, ASA 325 mg BID x 2 weeks DISCHARGE PLAN: Home DISCHARGE NEEDS: HHPT, Walker and 3-in-1 comode seat     Steven Ramsey 10/30/2017, 7:44 AM

## 2017-11-26 DIAGNOSIS — Z136 Encounter for screening for cardiovascular disorders: Secondary | ICD-10-CM | POA: Diagnosis not present

## 2017-11-26 DIAGNOSIS — Z0000 Encounter for general adult medical examination without abnormal findings: Secondary | ICD-10-CM | POA: Diagnosis not present

## 2018-02-17 DIAGNOSIS — M1712 Unilateral primary osteoarthritis, left knee: Secondary | ICD-10-CM | POA: Diagnosis not present

## 2018-02-24 DIAGNOSIS — M25562 Pain in left knee: Secondary | ICD-10-CM | POA: Diagnosis not present

## 2018-03-08 DIAGNOSIS — S83242A Other tear of medial meniscus, current injury, left knee, initial encounter: Secondary | ICD-10-CM | POA: Diagnosis not present

## 2018-03-08 DIAGNOSIS — M1712 Unilateral primary osteoarthritis, left knee: Secondary | ICD-10-CM | POA: Diagnosis not present

## 2018-03-19 DIAGNOSIS — H10501 Unspecified blepharoconjunctivitis, right eye: Secondary | ICD-10-CM | POA: Diagnosis not present

## 2018-04-01 DIAGNOSIS — G8918 Other acute postprocedural pain: Secondary | ICD-10-CM | POA: Diagnosis not present

## 2018-04-01 DIAGNOSIS — S83272A Complex tear of lateral meniscus, current injury, left knee, initial encounter: Secondary | ICD-10-CM | POA: Diagnosis not present

## 2018-04-01 DIAGNOSIS — M94262 Chondromalacia, left knee: Secondary | ICD-10-CM | POA: Diagnosis not present

## 2018-04-01 DIAGNOSIS — S83232A Complex tear of medial meniscus, current injury, left knee, initial encounter: Secondary | ICD-10-CM | POA: Diagnosis not present

## 2018-04-20 DIAGNOSIS — M25562 Pain in left knee: Secondary | ICD-10-CM | POA: Diagnosis not present

## 2018-04-22 DIAGNOSIS — M25562 Pain in left knee: Secondary | ICD-10-CM | POA: Diagnosis not present

## 2018-05-04 DIAGNOSIS — M25562 Pain in left knee: Secondary | ICD-10-CM | POA: Diagnosis not present

## 2018-05-11 DIAGNOSIS — M25562 Pain in left knee: Secondary | ICD-10-CM | POA: Diagnosis not present

## 2018-05-13 DIAGNOSIS — M25562 Pain in left knee: Secondary | ICD-10-CM | POA: Diagnosis not present

## 2018-05-20 DIAGNOSIS — M25562 Pain in left knee: Secondary | ICD-10-CM | POA: Diagnosis not present

## 2018-06-01 DIAGNOSIS — L255 Unspecified contact dermatitis due to plants, except food: Secondary | ICD-10-CM | POA: Diagnosis not present

## 2018-06-01 DIAGNOSIS — B360 Pityriasis versicolor: Secondary | ICD-10-CM | POA: Diagnosis not present

## 2018-06-18 DIAGNOSIS — M1712 Unilateral primary osteoarthritis, left knee: Secondary | ICD-10-CM | POA: Diagnosis not present

## 2018-08-09 DIAGNOSIS — M1712 Unilateral primary osteoarthritis, left knee: Secondary | ICD-10-CM | POA: Diagnosis not present

## 2018-11-29 DIAGNOSIS — N182 Chronic kidney disease, stage 2 (mild): Secondary | ICD-10-CM | POA: Diagnosis not present

## 2018-11-29 DIAGNOSIS — Z0000 Encounter for general adult medical examination without abnormal findings: Secondary | ICD-10-CM | POA: Diagnosis not present

## 2018-12-23 IMAGING — CR DG CHEST 2V
2 series · 2 of 2 positions shown · non-contrast
Comparison: Chest radiograph 03/04/2007.

CLINICAL DATA: Preoperative evaluation.  Right knee surgery.

EXAM:
CHEST  2 VIEW

[w chest pa]
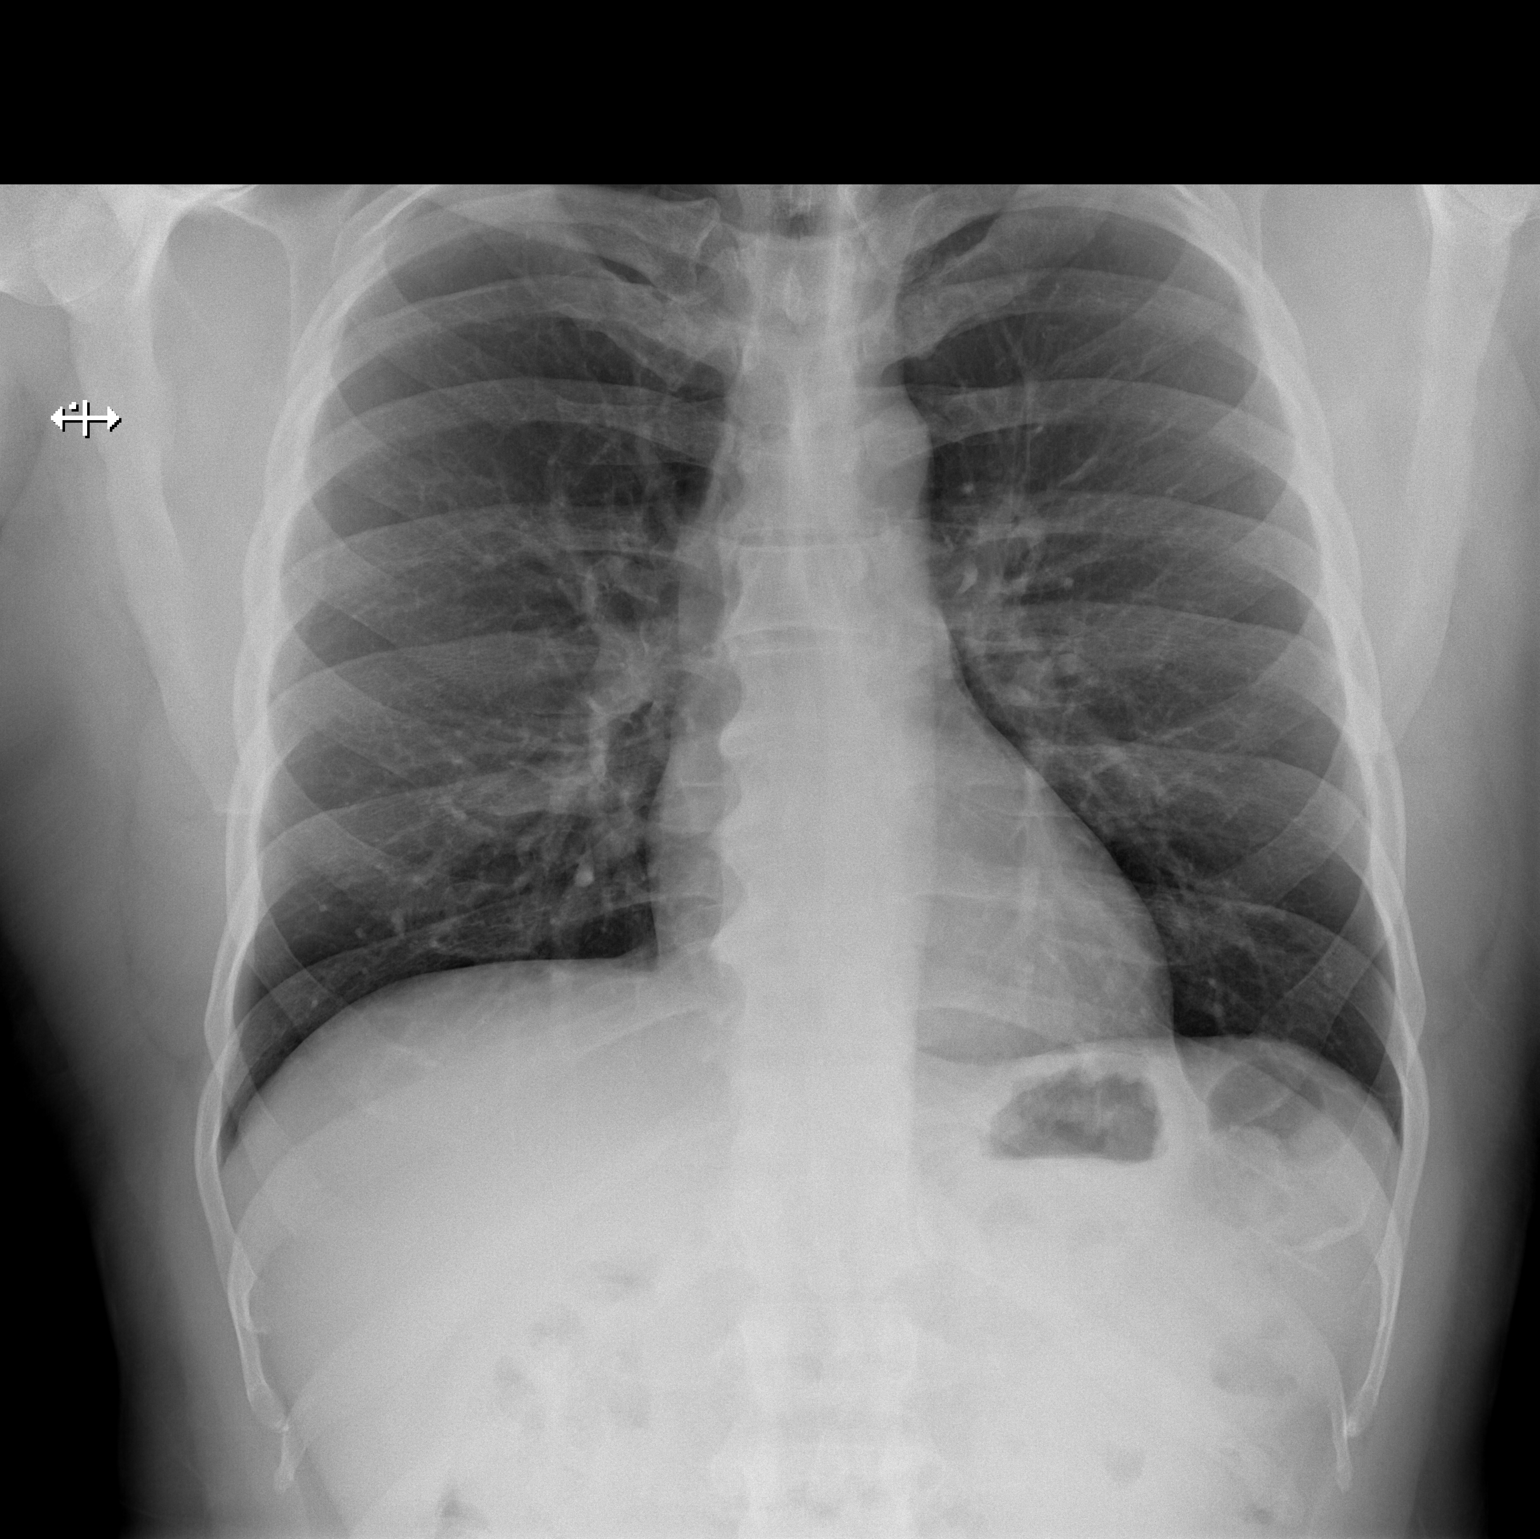

[w chest lat]
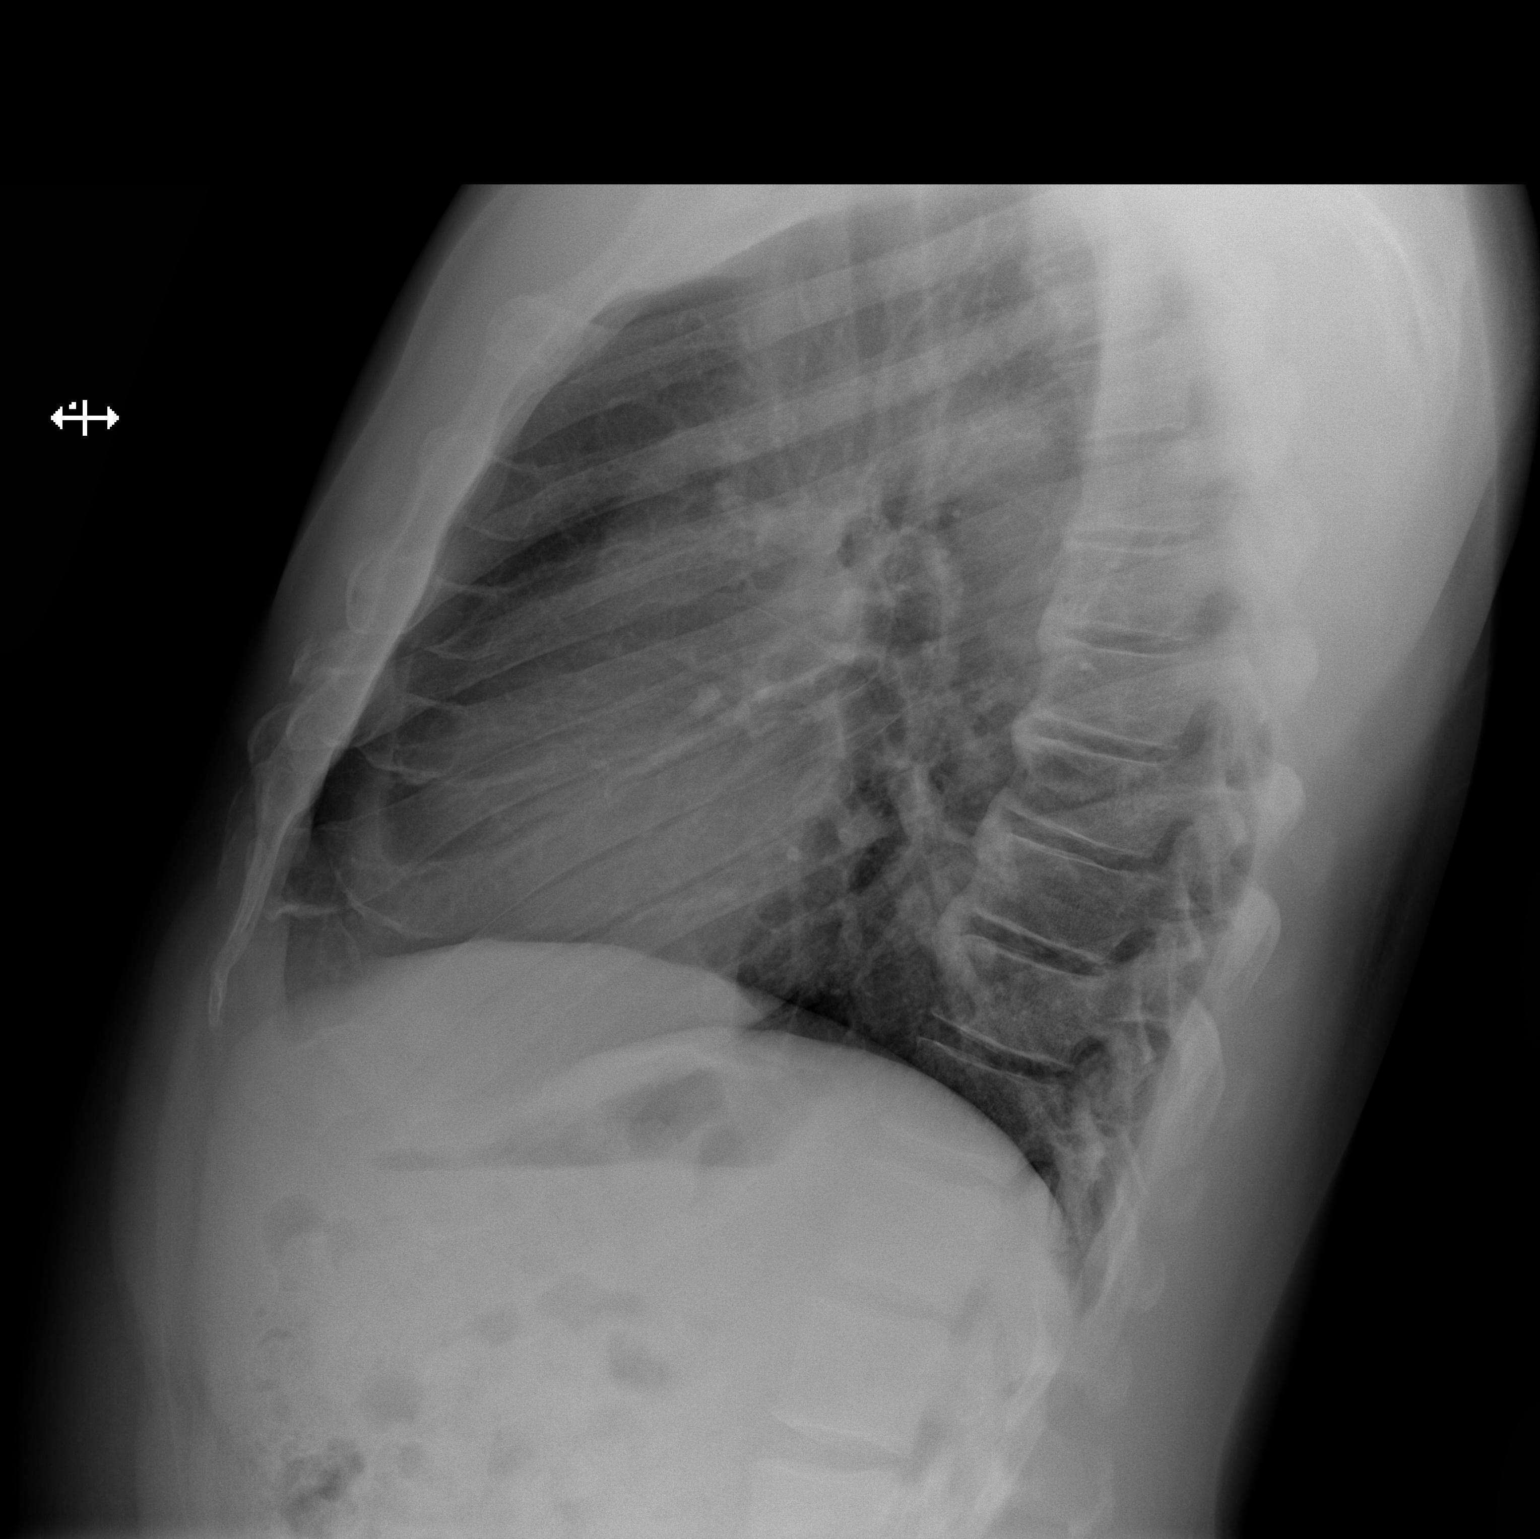

[2 of 2 positions shown; findings below may reference images not displayed]

FINDINGS: Stable cardiac and mediastinal contours. No consolidative pulmonary
opacities. No pleural effusion or pneumothorax. Thoracic spine
degenerative changes.
IMPRESSION: No acute cardiopulmonary process.

## 2019-11-02 ENCOUNTER — Ambulatory Visit: Payer: Managed Care, Other (non HMO) | Admitting: Family Medicine

## 2019-12-21 ENCOUNTER — Ambulatory Visit: Payer: Managed Care, Other (non HMO) | Admitting: Family Medicine

## 2023-05-22 DIAGNOSIS — I639 Cerebral infarction, unspecified: Secondary | ICD-10-CM

## 2023-05-22 HISTORY — DX: Cerebral infarction, unspecified: I63.9

## 2023-05-23 ENCOUNTER — Other Ambulatory Visit: Payer: Self-pay

## 2023-05-23 ENCOUNTER — Encounter (HOSPITAL_COMMUNITY): Payer: Self-pay | Admitting: Emergency Medicine

## 2023-05-23 ENCOUNTER — Emergency Department (HOSPITAL_COMMUNITY): Payer: Managed Care, Other (non HMO)

## 2023-05-23 ENCOUNTER — Inpatient Hospital Stay (HOSPITAL_COMMUNITY)
Admission: EM | Admit: 2023-05-23 | Discharge: 2023-05-25 | Disposition: A | Discharge status: Home / Self Care | Payer: 59 | Attending: Student | Admitting: Student

## 2023-05-23 DIAGNOSIS — Z96651 Presence of right artificial knee joint: Secondary | ICD-10-CM | POA: Diagnosis present

## 2023-05-23 DIAGNOSIS — R4781 Slurred speech: Principal | ICD-10-CM

## 2023-05-23 DIAGNOSIS — E669 Obesity, unspecified: Secondary | ICD-10-CM | POA: Diagnosis present

## 2023-05-23 DIAGNOSIS — I639 Cerebral infarction, unspecified: Secondary | ICD-10-CM | POA: Diagnosis present

## 2023-05-23 DIAGNOSIS — R7989 Other specified abnormal findings of blood chemistry: Secondary | ICD-10-CM | POA: Diagnosis present

## 2023-05-23 DIAGNOSIS — D696 Thrombocytopenia, unspecified: Secondary | ICD-10-CM | POA: Diagnosis present

## 2023-05-23 DIAGNOSIS — J45909 Unspecified asthma, uncomplicated: Secondary | ICD-10-CM | POA: Diagnosis present

## 2023-05-23 DIAGNOSIS — Z6830 Body mass index (BMI) 30.0-30.9, adult: Secondary | ICD-10-CM

## 2023-05-23 DIAGNOSIS — R202 Paresthesia of skin: Secondary | ICD-10-CM

## 2023-05-23 DIAGNOSIS — R29700 NIHSS score 0: Secondary | ICD-10-CM | POA: Diagnosis present

## 2023-05-23 DIAGNOSIS — R030 Elevated blood-pressure reading, without diagnosis of hypertension: Secondary | ICD-10-CM | POA: Diagnosis present

## 2023-05-23 DIAGNOSIS — Z7982 Long term (current) use of aspirin: Secondary | ICD-10-CM

## 2023-05-23 DIAGNOSIS — R471 Dysarthria and anarthria: Secondary | ICD-10-CM | POA: Diagnosis present

## 2023-05-23 DIAGNOSIS — I6381 Other cerebral infarction due to occlusion or stenosis of small artery: Principal | ICD-10-CM | POA: Diagnosis present

## 2023-05-23 DIAGNOSIS — E860 Dehydration: Secondary | ICD-10-CM | POA: Diagnosis present

## 2023-05-23 DIAGNOSIS — R200 Anesthesia of skin: Secondary | ICD-10-CM

## 2023-05-23 DIAGNOSIS — R7303 Prediabetes: Secondary | ICD-10-CM | POA: Diagnosis present

## 2023-05-23 DIAGNOSIS — N179 Acute kidney failure, unspecified: Secondary | ICD-10-CM | POA: Diagnosis present

## 2023-05-23 DIAGNOSIS — Z79899 Other long term (current) drug therapy: Secondary | ICD-10-CM

## 2023-05-23 DIAGNOSIS — R4701 Aphasia: Secondary | ICD-10-CM | POA: Diagnosis present

## 2023-05-23 DIAGNOSIS — E785 Hyperlipidemia, unspecified: Secondary | ICD-10-CM | POA: Diagnosis present

## 2023-05-23 LAB — COMPREHENSIVE METABOLIC PANEL
ALT: 53 U/L — ABNORMAL HIGH (ref 0–44)
AST: 71 U/L — ABNORMAL HIGH (ref 15–41)
Albumin: 4.5 g/dL (ref 3.5–5.0)
Alkaline Phosphatase: 60 U/L (ref 38–126)
Anion gap: 7 (ref 5–15)
BUN: 21 mg/dL — ABNORMAL HIGH (ref 6–20)
CO2: 27 mmol/L (ref 22–32)
Calcium: 9.4 mg/dL (ref 8.9–10.3)
Chloride: 104 mmol/L (ref 98–111)
Creatinine, Ser: 1.51 mg/dL — ABNORMAL HIGH (ref 0.61–1.24)
GFR, Estimated: 55 mL/min — ABNORMAL LOW (ref >60)
Glucose, Bld: 106 mg/dL — ABNORMAL HIGH (ref 70–99)
Potassium: 3.6 mmol/L (ref 3.5–5.1)
Sodium: 138 mmol/L (ref 135–145)
Total Bilirubin: 0.8 mg/dL (ref 0.3–1.2)
Total Protein: 7.8 g/dL (ref 6.5–8.1)

## 2023-05-23 LAB — CBC WITH DIFFERENTIAL/PLATELET
Abs Immature Granulocytes: 0.01 10*3/uL (ref 0.00–0.07)
Basophils Absolute: 0 10*3/uL (ref 0.0–0.1)
Basophils Relative: 0 %
Eosinophils Absolute: 0 10*3/uL (ref 0.0–0.5)
Eosinophils Relative: 1 %
HCT: 45.4 % (ref 39.0–52.0)
Hemoglobin: 14.1 g/dL (ref 13.0–17.0)
Immature Granulocytes: 0 %
Lymphocytes Relative: 30 %
Lymphs Abs: 1.7 10*3/uL (ref 0.7–4.0)
MCH: 23.4 pg — ABNORMAL LOW (ref 26.0–34.0)
MCHC: 31.1 g/dL (ref 30.0–36.0)
MCV: 75.3 fL — ABNORMAL LOW (ref 80.0–100.0)
Monocytes Absolute: 0.5 10*3/uL (ref 0.1–1.0)
Monocytes Relative: 10 %
Neutro Abs: 3.2 10*3/uL (ref 1.7–7.7)
Neutrophils Relative %: 59 %
Platelets: 125 10*3/uL — ABNORMAL LOW (ref 150–400)
RBC: 6.03 MIL/uL — ABNORMAL HIGH (ref 4.22–5.81)
RDW: 15.6 % — ABNORMAL HIGH (ref 11.5–15.5)
WBC: 5.5 10*3/uL (ref 4.0–10.5)
nRBC: 0 % (ref 0.0–0.2)

## 2023-05-23 LAB — APTT: aPTT: 30 seconds (ref 24–36)

## 2023-05-23 LAB — CBG MONITORING, ED: Glucose-Capillary: 117 mg/dL — ABNORMAL HIGH (ref 70–99)

## 2023-05-23 LAB — PROTIME-INR
INR: 1 (ref 0.8–1.2)
Prothrombin Time: 13.5 seconds (ref 11.4–15.2)

## 2023-05-23 LAB — ETHANOL: Alcohol, Ethyl (B): <10 mg/dL (ref <10)

## 2023-05-23 NOTE — ED Notes (Signed)
MD made aware of patient's symptoms.  Verbal order received for Non-contrasted CT of head

## 2023-05-23 NOTE — ED Provider Notes (Signed)
[place] EMERGENCY DEPARTMENT AT [facility] Provider Note  CSN: [id number] Arrival date & time: 05/23/23 2128  Chief Complaint(s) Numbness (Left face/) and Aphasia  HPI Steven Ramsey is a 52 y.o. male who presents to the emergency department with slurred speech and left face numbness.  He reports last known seen by wife around 9:30 p.m. yesterday evening.  This morning the patient awoke and went to the gym.  He states he felt fine and did not notice any abnormalities.  He reports taking new preworkout regimen including creatine essential amino acids and branched chain amino acids (this is new).  Patient reports that while working out he felt "off.  When he got home he reported this to the wife.  She initially reported that she did not notice any difference but to me stated that he had slight slurred speech like his tongue was unable to enunciate his words.  They decided to come this evening to be evaluated instead of waiting till tomorrow.  She reports that it and route this large piece got worse but after arriving here it had significantly improved.  Patient denies any associated chest pain or shortness of breath.  No visual disturbance.  No focal deficits.  No instability ambulating.  HPI  Past Medical History Past Medical History:  Diagnosis Date   Arthritis    Asthma    Patient Active Problem List   Diagnosis Date Noted   Primary osteoarthritis of right knee 10/28/2017   Osteoarthritis of right knee 10/27/2017   Home Medication(s) Prior to Admission medications   Medication Sig Start Date End Date Taking? Authorizing Provider  amoxicillin (AMOXIL) 500 MG capsule TAKE 4 TABLETS BY MOUTH 1 HOUR PRIOR TO DENTAL PROCEDURE   Yes [provider]  aspirin EC 325 MG tablet Take 1 tablet (325 mg total) by mouth 2 (two) times daily. Patient not taking: Reported on 05/24/2023 10/28/17   Steven Katz, PA-C  gabapentin (NEURONTIN) 300 MG capsule Take 1 capsule (300  mg total) by mouth 3 (three) times daily. 10/28/17 10/28/18  Steven Katz, PA-C  methocarbamol (ROBAXIN) 500 MG tablet Take 1 tablet (500 mg total) by mouth 2 (two) times daily with a meal. Patient not taking: Reported on 05/24/2023 10/28/17   Steven Katz, PA-C  oxyCODONE-acetaminophen (PERCOCET/ROXICET) 5-325 MG tablet Take 1 tablet by mouth every 4 (four) hours as needed for severe pain. Patient not taking: Reported on 05/24/2023 10/28/17   Steven Katz, PA-C  PROAIR HFA 108 (90 BASE) MCG/ACT inhaler INHALE 2 PUFFS EVERY 6 HOURS AS NEEDED FOR COUGH OR WHEEZE *MAY USE 2 PUFFS 10-20 PRIOR TO EXERCISE Patient not taking: Reported on 05/24/2023 07/26/15   Baxter Hire, MD                                                                                                                                    Allergies  Patient has no known allergies.  Review of Systems Review of Systems As noted in HPI  Physical Exam Vital Signs  I have reviewed the triage vital signs BP 132/81   Pulse 89   Temp 97.9 F (36.6 C) (Oral)   Resp 17   Ht 6\' 1"  (1.854 m)   Wt 104.3 kg   SpO2 99%   BMI 30.34 kg/m   Physical Exam Vitals reviewed.  Constitutional:      General: He is not in acute distress.    Appearance: He is well-developed. He is not diaphoretic.  HENT:     Head: Normocephalic and atraumatic.     Nose: Nose normal.  Eyes:     General: No scleral icterus.       Right eye: No discharge.        Left eye: No discharge.     Conjunctiva/sclera: Conjunctivae normal.     Pupils: Pupils are equal, round, and reactive to light.  Cardiovascular:     Rate and Rhythm: Normal rate and regular rhythm.     Heart sounds: No murmur heard.    No friction rub. No gallop.  Pulmonary:     Effort: Pulmonary effort is normal. No respiratory distress.     Breath sounds: Normal breath sounds. No stridor. No rales.  Abdominal:     General: There is no distension.     Palpations: Abdomen is soft.      Tenderness: There is no abdominal tenderness.  Musculoskeletal:        General: No tenderness.     Cervical back: Normal range of motion and neck supple.  Skin:    General: Skin is warm and dry.     Findings: No erythema or rash.  Neurological:     Mental Status: He is alert and oriented to person, place, and time.     Comments: Mental Status:  Alert and oriented to person, place, and time.  Attention and concentration normal.  Speech clear.  Recent memory is intact  Cranial Nerves:  II Visual Fields: Intact to confrontation. Visual fields intact. III, IV, VI: Pupils equal and reactive to light and near. Full eye movement without nystagmus  V Facial Sensation: Normal. No weakness of masticatory muscles  VII: No facial weakness or asymmetry  VIII Auditory Acuity: Grossly normal  IX/X: The uvula is midline; the palate elevates symmetrically  XI: Normal sternocleidomastoid and trapezius strength  XII: The tongue is midline. No atrophy or fasciculations.   Motor System: Muscle Strength: 5/5 and symmetric in the upper and lower extremities. No pronation or drift.  Muscle Tone: Tone and muscle bulk are normal in the upper and lower extremities.  Coordination: Intact finger-to-nose. No tremor.  Sensation: Intact to light touch.  Gait: Routine gait normal.      ED Results and Treatments Labs (all labs ordered are listed, but only abnormal results are displayed) Labs Reviewed  CBC WITH DIFFERENTIAL/PLATELET - Abnormal; Notable for the following components:      Result Value   RBC 6.03 (*)    MCV 75.3 (*)    MCH 23.4 (*)    RDW 15.6 (*)    Platelets 125 (*)    All other components within normal limits  COMPREHENSIVE METABOLIC PANEL - Abnormal; Notable for the following components:   Glucose, Bld 106 (*)    BUN 21 (*)    Creatinine, Ser 1.51 (*)    AST 71 (*)    ALT 53 (*)  GFR, Estimated 55 (*)    All other components within normal limits  CBG MONITORING, ED -  Abnormal; Notable for the following components:   Glucose-Capillary 117 (*)    All other components within normal limits  PROTIME-INR  APTT  ETHANOL  TROPONIN I (HIGH SENSITIVITY)  TROPONIN I (HIGH SENSITIVITY)                                                                                                                         EKG  EKG Interpretation Date/Time:  Saturday May 23 2023 23:30:18 EDT Ventricular Rate:  58 PR Interval:  203 QRS Duration:  90 QT Interval:  392 QTC Calculation: 385 R Axis:   65  Text Interpretation: Sinus rhythm Borderline prolonged PR interval Borderline repolarization abnormality Borderline ST elevation, anterolateral leads Confirmed by Drema Pry [phone]) on 05/23/2023 11:58:28 PM       Radiology CT Head Wo Contrast  Result Date: 05/23/2023 CLINICAL DATA:  Paresthesias. EXAM: CT HEAD WITHOUT CONTRAST TECHNIQUE: Contiguous axial images were obtained from the base of the skull through the vertex without intravenous contrast. RADIATION DOSE REDUCTION: This exam was performed according to the departmental dose-optimization program which includes automated exposure control, adjustment of the mA and/or kV according to patient size and/or use of iterative reconstruction technique. COMPARISON:  None Available. FINDINGS: Brain: No evidence of acute infarction, hemorrhage, hydrocephalus, extra-axial collection or mass lesion/mass effect. There is a small focal hypodensity in the left basal ganglia measuring 5 x 5 mm. Vascular: No hyperdense vessel or unexpected calcification. Skull: Normal. Negative for fracture or focal lesion. Sinuses/Orbits: No acute finding. Other: None. IMPRESSION: 1. No acute intracranial process. 2. Small focal hypodensity in the left basal ganglia measuring 5 x 5 mm. This may represent an old lacunar infarct versus prominent perivascular space. Electronically Signed   By: Darliss Cheney M.D.   On: 05/23/2023 22:36    Medications Ordered  in ED Medications  sodium chloride 0.9 % bolus 1,000 mL (0 mLs Intravenous Stopped 05/24/23 0230)   Procedures Procedures  (including critical care time) Medical Decision Making / ED Course   Medical Decision Making Amount and/or Complexity of Data Reviewed Labs: ordered. Decision-making details documented in ED Course. Radiology: ordered and independent interpretation performed. Decision-making details documented in ED Course. ECG/medicine tests: ordered and independent interpretation performed. Decision-making details documented in ED Course.  Risk Decision regarding hospitalization.    Slurred speech and facial numbness.  DDx listed below.  No focal deficits noted on exam.  Possible TIA.  Also considering electrolyte abnormalities.  CT head was ordered in triage and noted for small focal hypodensity in the left basal ganglia, possibly old lacunar infarct versus prominent perivascular space.  CBC without leukocytosis or anemia Metabolic panel without significant electrolyte derangements.  Mild hyperglycemia without DKA.  Elevated Cr. - mild renal insufficiency versus creatine use.  Mild transaminitis.  EKG with borderline ST changes in anterior leads.  Troponin negative. Not consistent with ACS.  Spoke with neurology who agreed with getting an MRI given the CT changes. ABCD2 3. MRI not available overnight. Can wait until the morning for MRI. Plan to sign out to morning team.     Final Clinical Impression(s) / ED Diagnoses Final diagnoses:  Slurred speech  Numbness and tingling of left side of face    This chart was dictated using voice recognition software.  Despite best efforts to proofread,  errors can occur which can change the documentation meaning.    Nira Conn, MD 05/24/23 [phone]

## 2023-05-23 NOTE — ED Triage Notes (Signed)
Patient coming to ED for evaluation of L sided facial numbness and "he speech is different."  Wife reports last seeing him normal last night around 9:30 PM.  This morning patient woke up at 7 AM and went to the gym.  States he felt normal.  Did try new supplements this morning prior to gym.  At 11 AM he returned home and told his wife that "he didn't feel right."  Wife states "his speech has become slurred during the day" but unable to give a time.  No reports of numbness or weakness to bilateral legs/arms. Mild facial droop noted to L side of face.

## 2023-05-24 ENCOUNTER — Encounter (HOSPITAL_COMMUNITY): Payer: Self-pay | Admitting: Internal Medicine

## 2023-05-24 ENCOUNTER — Emergency Department (HOSPITAL_COMMUNITY): Payer: 59

## 2023-05-24 ENCOUNTER — Inpatient Hospital Stay (HOSPITAL_COMMUNITY): Payer: 59

## 2023-05-24 ENCOUNTER — Other Ambulatory Visit (HOSPITAL_COMMUNITY): Payer: Managed Care, Other (non HMO)

## 2023-05-24 DIAGNOSIS — I6389 Other cerebral infarction: Secondary | ICD-10-CM | POA: Diagnosis not present

## 2023-05-24 DIAGNOSIS — I639 Cerebral infarction, unspecified: Secondary | ICD-10-CM | POA: Diagnosis present

## 2023-05-24 DIAGNOSIS — E860 Dehydration: Secondary | ICD-10-CM | POA: Diagnosis present

## 2023-05-24 DIAGNOSIS — I6381 Other cerebral infarction due to occlusion or stenosis of small artery: Secondary | ICD-10-CM | POA: Diagnosis present

## 2023-05-24 DIAGNOSIS — R29700 NIHSS score 0: Secondary | ICD-10-CM | POA: Diagnosis present

## 2023-05-24 DIAGNOSIS — N179 Acute kidney failure, unspecified: Secondary | ICD-10-CM | POA: Diagnosis present

## 2023-05-24 DIAGNOSIS — E785 Hyperlipidemia, unspecified: Secondary | ICD-10-CM | POA: Diagnosis present

## 2023-05-24 DIAGNOSIS — E669 Obesity, unspecified: Secondary | ICD-10-CM | POA: Diagnosis present

## 2023-05-24 DIAGNOSIS — R4701 Aphasia: Secondary | ICD-10-CM | POA: Diagnosis present

## 2023-05-24 DIAGNOSIS — Z96651 Presence of right artificial knee joint: Secondary | ICD-10-CM | POA: Diagnosis present

## 2023-05-24 DIAGNOSIS — R030 Elevated blood-pressure reading, without diagnosis of hypertension: Secondary | ICD-10-CM | POA: Diagnosis present

## 2023-05-24 DIAGNOSIS — Z79899 Other long term (current) drug therapy: Secondary | ICD-10-CM | POA: Diagnosis not present

## 2023-05-24 DIAGNOSIS — R4781 Slurred speech: Secondary | ICD-10-CM | POA: Diagnosis present

## 2023-05-24 DIAGNOSIS — D696 Thrombocytopenia, unspecified: Secondary | ICD-10-CM | POA: Diagnosis present

## 2023-05-24 DIAGNOSIS — J45909 Unspecified asthma, uncomplicated: Secondary | ICD-10-CM | POA: Diagnosis present

## 2023-05-24 DIAGNOSIS — R471 Dysarthria and anarthria: Secondary | ICD-10-CM | POA: Diagnosis present

## 2023-05-24 DIAGNOSIS — R7303 Prediabetes: Secondary | ICD-10-CM | POA: Diagnosis present

## 2023-05-24 DIAGNOSIS — Z6830 Body mass index (BMI) 30.0-30.9, adult: Secondary | ICD-10-CM | POA: Diagnosis not present

## 2023-05-24 DIAGNOSIS — Z7982 Long term (current) use of aspirin: Secondary | ICD-10-CM | POA: Diagnosis not present

## 2023-05-24 DIAGNOSIS — R7989 Other specified abnormal findings of blood chemistry: Secondary | ICD-10-CM | POA: Diagnosis present

## 2023-05-24 LAB — URINALYSIS, ROUTINE W REFLEX MICROSCOPIC
Bilirubin Urine: NEGATIVE
Glucose, UA: NEGATIVE mg/dL
Hgb urine dipstick: NEGATIVE
Ketones, ur: NEGATIVE mg/dL
Leukocytes,Ua: NEGATIVE
Nitrite: NEGATIVE
Protein, ur: NEGATIVE mg/dL
Specific Gravity, Urine: >1.046 — ABNORMAL HIGH (ref 1.005–1.030)
pH: 6 (ref 5.0–8.0)

## 2023-05-24 LAB — IRON AND TIBC
Iron: 72 ug/dL (ref 45–182)
Saturation Ratios: 23 % (ref 17.9–39.5)
TIBC: 319 ug/dL (ref 250–450)
UIBC: 247 ug/dL

## 2023-05-24 LAB — TROPONIN I (HIGH SENSITIVITY): Troponin I (High Sensitivity): 16 ng/L (ref <18)

## 2023-05-24 LAB — RETICULOCYTES
Immature Retic Fract: 14.3 % (ref 2.3–15.9)
RBC.: 4.93 MIL/uL (ref 4.22–5.81)
Retic Count, Absolute: 61.1 10*3/uL (ref 19.0–186.0)
Retic Ct Pct: 1.2 % (ref 0.4–3.1)

## 2023-05-24 LAB — FERRITIN: Ferritin: 139 ng/mL (ref 24–336)

## 2023-05-24 LAB — VITAMIN B12: Vitamin B-12: 392 pg/mL (ref 180–914)

## 2023-05-24 LAB — FOLATE: Folate: 10.7 ng/mL (ref >5.9)

## 2023-05-24 LAB — HEMOGLOBIN A1C
Hgb A1c MFr Bld: 6 % — ABNORMAL HIGH (ref 4.8–5.6)
Mean Plasma Glucose: 125.5 mg/dL

## 2023-05-24 LAB — HIV ANTIBODY (ROUTINE TESTING W REFLEX): HIV Screen 4th Generation wRfx: NONREACTIVE

## 2023-05-24 MED ORDER — ACETAMINOPHEN 325 MG PO TABS: 650.0000 mg | ORAL_TABLET | ORAL | Status: DC | PRN

## 2023-05-24 MED ORDER — ASPIRIN 325 MG PO TBEC
325.0000 mg | DELAYED_RELEASE_TABLET | Freq: Every day | ORAL | Status: DC
  Administered 2023-05-24 – 2023-05-25 (×2): 325 mg via ORAL
  Filled 2023-05-24 (×2): qty 1

## 2023-05-24 MED ORDER — STROKE: EARLY STAGES OF RECOVERY BOOK
Freq: Once | Status: AC
  Filled 2023-05-24: qty 1

## 2023-05-24 MED ORDER — SODIUM CHLORIDE 0.9 % IV SOLN: INTRAVENOUS | Status: DC

## 2023-05-24 MED ORDER — ENOXAPARIN SODIUM 40 MG/0.4ML IJ SOSY
40.0000 mg | PREFILLED_SYRINGE | INTRAMUSCULAR | Status: DC
  Administered 2023-05-24 – 2023-05-25 (×2): 40 mg via SUBCUTANEOUS
  Filled 2023-05-24 (×2): qty 0.4

## 2023-05-24 MED ORDER — HYDRALAZINE HCL 20 MG/ML IJ SOLN: 5.0000 mg | Freq: Four times a day (QID) | INTRAMUSCULAR | Status: DC | PRN

## 2023-05-24 MED ORDER — ACETAMINOPHEN 160 MG/5ML PO SOLN: 650.0000 mg | ORAL | Status: DC | PRN

## 2023-05-24 MED ORDER — SODIUM CHLORIDE 0.9 % IV BOLUS
1000.0000 mL | Freq: Once | INTRAVENOUS | Status: AC
  Administered 2023-05-24: 1000 mL via INTRAVENOUS

## 2023-05-24 MED ORDER — SODIUM CHLORIDE (PF) 0.9 % IJ SOLN
INTRAMUSCULAR | Status: AC
  Filled 2023-05-24: qty 50

## 2023-05-24 MED ORDER — ONDANSETRON HCL 4 MG/2ML IJ SOLN: 4.0000 mg | Freq: Four times a day (QID) | INTRAMUSCULAR | Status: DC | PRN

## 2023-05-24 MED ORDER — IOHEXOL 350 MG/ML SOLN
75.0000 mL | Freq: Once | INTRAVENOUS | Status: AC | PRN
  Administered 2023-05-24: 75 mL via INTRAVENOUS

## 2023-05-24 MED ORDER — ACETAMINOPHEN 650 MG RE SUPP: 650.0000 mg | RECTAL | Status: DC | PRN

## 2023-05-24 MED ORDER — SENNOSIDES-DOCUSATE SODIUM 8.6-50 MG PO TABS: 1.0000 | ORAL_TABLET | Freq: Every evening | ORAL | Status: DC | PRN

## 2023-05-24 MED ORDER — ALPRAZOLAM 0.5 MG PO TABS
0.5000 mg | ORAL_TABLET | Freq: Once | ORAL | Status: AC
  Administered 2023-05-24: 0.5 mg via ORAL
  Filled 2023-05-24: qty 1

## 2023-05-24 NOTE — ED Provider Notes (Signed)
Blood pressure (!) 140/77, pulse 67, temperature 97.8 F (36.6 C), temperature source Oral, resp. rate 15, height 6\' 1"  (1.854 m), weight 104.3 kg, SpO2 98%.  Assuming care from Dr. Eudelia Bunch.  In short, Steven Ramsey is a 52 y.o. male with a chief complaint of Numbness (Left face/) and Aphasia .  Refer to the original H&P for additional details.  The current plan of care is to follow up on MRI.  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  Plan for admit.  Will coordinate with neurology.   Spoke with Dr. Selina Cooley. They will consult when the patient arrives to [facility]. Will admit to [facility].    Maia Plan, MD 05/24/23 [phone]

## 2023-05-24 NOTE — ED Provider Notes (Incomplete)
[place] EMERGENCY DEPARTMENT AT [facility] Provider Note  CSN: [id number] Arrival date & time: 05/23/23 2128  Chief Complaint(s) Facial Droop  HPI Steven Ramsey is a 52 y.o. male who presents to the emergency department with slurred speech and left face numbness.  He reports last known seen by wife around 9:30 p.m. yesterday evening.  This morning the patient awoke and went to the gym.  He states he felt fine and did not notice any abnormalities.  He reports taking new preworkout regimen including creatine essential amino acids and branched chain amino acids (this is new).  Patient reports that while working out he felt "off.  When he got home he reported this to the wife.  She initially reported that she did not notice any difference but to me stated that he had slight slurred speech like his tongue was unable to enunciate his words.  They decided to come this evening to be evaluated instead of waiting till tomorrow.  She reports that it and route this large piece got worse but after arriving here it had significantly improved.  Patient denies any associated chest pain or shortness of breath.  No visual disturbance.  No focal deficits.  No instability ambulating.  HPI  Past Medical History Past Medical History:  Diagnosis Date  . Arthritis   . Asthma    Patient Active Problem List   Diagnosis Date Noted  . Primary osteoarthritis of right knee 10/28/2017  . Osteoarthritis of right knee 10/27/2017   Home Medication(s) Prior to Admission medications   Medication Sig Start Date End Date Taking? Authorizing Provider  aspirin EC 325 MG tablet Take 1 tablet (325 mg total) by mouth 2 (two) times daily. 10/28/17   [name], PA-C  gabapentin (NEURONTIN) 300 MG capsule Take 1 capsule (300 mg total) by mouth 3 (three) times daily. 10/28/17 10/28/18  [name], PA-C  methocarbamol (ROBAXIN) 500 MG tablet Take 1 tablet (500 mg total) by mouth 2 (two) times daily with a  meal. 10/28/17   [name], PA-C  oxyCODONE-acetaminophen (PERCOCET/ROXICET) 5-325 MG tablet Take 1 tablet by mouth every 4 (four) hours as needed for severe pain. 10/28/17   Dannielle Burn K, PA-C  PROAIR HFA 108 (90 BASE) MCG/ACT inhaler INHALE 2 PUFFS EVERY 6 HOURS AS NEEDED FOR COUGH OR WHEEZE *MAY USE 2 PUFFS 10-20 PRIOR TO EXERCISE 07/26/15   Baxter Hire, MD                                                                                                                                    Allergies Patient has no known allergies.  Review of Systems Review of Systems As noted in HPI  Physical Exam Vital Signs  I have reviewed the triage vital signs BP (!) 176/97 (BP Location: Right Arm)   Pulse 75   Temp 98.4 F (36.9 C) (Oral)  Resp 18   Ht 6\' 1"  (1.854 m)   Wt 104.3 kg   SpO2 100%   BMI 30.34 kg/m   Physical Exam Vitals reviewed.  Constitutional:      General: He is not in acute distress.    Appearance: He is well-developed. He is not diaphoretic.  HENT:     Head: Normocephalic and atraumatic.     Nose: Nose normal.  Eyes:     General: No scleral icterus.       Right eye: No discharge.        Left eye: No discharge.     Conjunctiva/sclera: Conjunctivae normal.     Pupils: Pupils are equal, round, and reactive to light.  Cardiovascular:     Rate and Rhythm: Normal rate and regular rhythm.     Heart sounds: No murmur heard.    No friction rub. No gallop.  Pulmonary:     Effort: Pulmonary effort is normal. No respiratory distress.     Breath sounds: Normal breath sounds. No stridor. No rales.  Abdominal:     General: There is no distension.     Palpations: Abdomen is soft.     Tenderness: There is no abdominal tenderness.  Musculoskeletal:        General: No tenderness.     Cervical back: Normal range of motion and neck supple.  Skin:    General: Skin is warm and dry.     Findings: No erythema or rash.  Neurological:     Mental Status: He is alert  and oriented to person, place, and time.     Comments: Mental Status:  Alert and oriented to person, place, and time.  Attention and concentration normal.  Speech clear.  Recent memory is intact  Cranial Nerves:  II Visual Fields: Intact to confrontation. Visual fields intact. III, IV, VI: Pupils equal and reactive to light and near. Full eye movement without nystagmus  V Facial Sensation: Normal. No weakness of masticatory muscles  VII: No facial weakness or asymmetry  VIII Auditory Acuity: Grossly normal  IX/X: The uvula is midline; the palate elevates symmetrically  XI: Normal sternocleidomastoid and trapezius strength  XII: The tongue is midline. No atrophy or fasciculations.   Motor System: Muscle Strength: 5/5 and symmetric in the upper and lower extremities. No pronation or drift.  Muscle Tone: Tone and muscle bulk are normal in the upper and lower extremities.  Coordination: Intact finger-to-nose. No tremor.  Sensation: Intact to light touch.  Gait: Routine gait normal.      ED Results and Treatments Labs (all labs ordered are listed, but only abnormal results are displayed) Labs Reviewed  CBC WITH DIFFERENTIAL/PLATELET - Abnormal; Notable for the following components:      Result Value   RBC 6.03 (*)    MCV 75.3 (*)    MCH 23.4 (*)    RDW 15.6 (*)    Platelets 125 (*)    All other components within normal limits  COMPREHENSIVE METABOLIC PANEL - Abnormal; Notable for the following components:   Glucose, Bld 106 (*)    BUN 21 (*)    Creatinine, Ser 1.51 (*)    AST 71 (*)    ALT 53 (*)    GFR, Estimated 55 (*)    All other components within normal limits  CBG MONITORING, ED - Abnormal; Notable for the following components:   Glucose-Capillary 117 (*)    All other components within normal limits  PROTIME-INR  APTT  ETHANOL  TROPONIN I (HIGH SENSITIVITY)                                                                                                                          EKG  EKG Interpretation Date/Time:  Saturday May 23 2023 23:30:18 EDT Ventricular Rate:  58 PR Interval:  203 QRS Duration:  90 QT Interval:  392 QTC Calculation: 385 R Axis:   65  Text Interpretation: Sinus rhythm Borderline prolonged PR interval Borderline repolarization abnormality Borderline ST elevation, anterolateral leads Confirmed by Drema Pry [phone]) on 05/23/2023 11:58:28 PM       Radiology CT Head Wo Contrast  Result Date: 05/23/2023 CLINICAL DATA:  Paresthesias. EXAM: CT HEAD WITHOUT CONTRAST TECHNIQUE: Contiguous axial images were obtained from the base of the skull through the vertex without intravenous contrast. RADIATION DOSE REDUCTION: This exam was performed according to the departmental dose-optimization program which includes automated exposure control, adjustment of the mA and/or kV according to patient size and/or use of iterative reconstruction technique. COMPARISON:  None Available. FINDINGS: Brain: No evidence of acute infarction, hemorrhage, hydrocephalus, extra-axial collection or mass lesion/mass effect. There is a small focal hypodensity in the left basal ganglia measuring 5 x 5 mm. Vascular: No hyperdense vessel or unexpected calcification. Skull: Normal. Negative for fracture or focal lesion. Sinuses/Orbits: No acute finding. Other: None. IMPRESSION: 1. No acute intracranial process. 2. Small focal hypodensity in the left basal ganglia measuring 5 x 5 mm. This may represent an old lacunar infarct versus prominent perivascular space. Electronically Signed   By: Darliss Cheney M.D.   On: 05/23/2023 22:36    Medications Ordered in ED Medications - No data to display Procedures Procedures  (including critical care time) Medical Decision Making / ED Course   Medical Decision Making Amount and/or Complexity of Data Reviewed Labs: ordered. Radiology: ordered.    Slurred speech and facial numbness.  DDx listed below.  No focal deficits  noted on exam.  Possible TIA.  Also considering electrolyte abnormalities.     Final Clinical Impression(s) / ED Diagnoses Final diagnoses:  None    This chart was dictated using voice recognition software.  Despite best efforts to proofread,  errors can occur which can change the documentation meaning.

## 2023-05-24 NOTE — ED Notes (Signed)
I attempted to call report to RN at [facility] 1O10 at 18:50. I was told the receiving RN was unavailable and to call back after 19:00

## 2023-05-24 NOTE — Progress Notes (Addendum)
PT Cancellation Note  Patient Details Name: Steven Ramsey MRN: [id number] DOB: 1971/01/07   Cancelled Treatment:    Reason Eval/Treat Not Completed: PT screened, no needs identified, will sign off; pt states he has been up ambulating to bathroom, feels all issues have resolved; denies N/T. Mobility and strength at baseline per pt and wife PT signing off   [facility] 05/24/2023, 2:36 PM

## 2023-05-24 NOTE — H&P (Signed)
History and Physical  Steven Ramsey ZOX:[id number] DOB: 1971-01-02 DOA: 05/23/2023  PCP: Ollen Bowl, MD   Chief Complaint: Left facial numbness  HPI: Steven Ramsey Steven Ramsey is a 52 y.o. male without significant past medical history on no prescription medications who does get regular PCP follow-up being admitted to the hospital with acute left basal ganglia perforator infarction.  Patient states he has been in his usual state of health, until yesterday afternoon when he experienced about 2 hours of left facial numbness, and a little bit of altered speech.  He did not have any confusion, knew what he wanted to say, but it felt weird trying to get the words out.  Does not think he had any facial droop.  Did have a vague sense of swelling on the left side of his face.  Did not have any headache, no extremity numbness, tingling, difficulty ambulating, etc.  Has never had symptoms like this before.  Symptoms resolved in a couple of hours before he arrived to the emergency department.  ED Course: On initial evaluation in the emergency department, vitals were significant for blood pressure 176/97.  It seems that this resolved spontaneously.  CBC and CMP were remarkable for elevated creatinine, and low platelets.  Review of Systems: Please see HPI for pertinent positives and negatives. A complete 10 system review of systems are otherwise negative.  Past Medical History:  Diagnosis Date   Arthritis    Asthma    Past Surgical History:  Procedure Laterality Date   MENISCUS REPAIR Right    SHOULDER ARTHROSCOPY WITH ROTATOR CUFF REPAIR     Right   TOTAL KNEE ARTHROPLASTY Right 10/28/2017   TOTAL KNEE ARTHROPLASTY Right 10/28/2017   Procedure: RIGHT TOTAL KNEE ARTHROPLASTY;  Mirante: Gean Birchwood, MD;  Location: [facility] OR;  Service: Orthopedics;  Laterality: Right;   WRIST SURGERY Left     Social History:  reports that he has never smoked. He has never used smokeless tobacco. He reports that he does  not drink alcohol and does not use drugs.   No Known Allergies  History reviewed. No pertinent family history.   Prior to Admission medications   Medication Sig Start Date End Date Taking? Authorizing Provider  amoxicillin (AMOXIL) 500 MG capsule TAKE 4 TABLETS BY MOUTH 1 HOUR PRIOR TO DENTAL PROCEDURE   Yes [provider]  aspirin EC 325 MG tablet Take 1 tablet (325 mg total) by mouth 2 (two) times daily. Patient not taking: Reported on 05/24/2023 10/28/17   [name], PA-C  gabapentin (NEURONTIN) 300 MG capsule Take 1 capsule (300 mg total) by mouth 3 (three) times daily. 10/28/17 10/28/18  [name], PA-C  methocarbamol (ROBAXIN) 500 MG tablet Take 1 tablet (500 mg total) by mouth 2 (two) times daily with a meal. Patient not taking: Reported on 05/24/2023 10/28/17   [name], PA-C  oxyCODONE-acetaminophen (PERCOCET/ROXICET) 5-325 MG tablet Take 1 tablet by mouth every 4 (four) hours as needed for severe pain. Patient not taking: Reported on 05/24/2023 10/28/17   [name], PA-C  PROAIR HFA 108 (90 BASE) MCG/ACT inhaler INHALE 2 PUFFS EVERY 6 HOURS AS NEEDED FOR COUGH OR WHEEZE *MAY USE 2 PUFFS 10-20 PRIOR TO EXERCISE Patient not taking: Reported on 05/24/2023 07/26/15   Baxter Hire, MD    Physical Exam: BP (!) 140/77 (BP Location: Right Arm)   Pulse 67   Temp 97.8 F (36.6 C) (Oral)   Resp 15   Ht 6'  1" (1.854 m)   Wt 104.3 kg   SpO2 98%   BMI 30.34 kg/m   General:  Alert, oriented, calm, in no acute distress  Eyes: EOMI, clear conjuctivae, white sclerea Neck: supple, no masses, trachea mildline  Cardiovascular: RRR, no murmurs or rubs, no peripheral edema  Respiratory: clear to auscultation bilaterally, no wheezes, no crackles  Abdomen: soft, nontender, nondistended, normal bowel tones heard  Skin: dry, no rashes  Musculoskeletal: no joint effusions, normal range of motion  Psychiatric: appropriate affect, normal speech  Neurologic:  extraocular muscles intact, clear speech, moving all extremities with intact sensorium          Labs on Admission:  Basic Metabolic Panel: Recent Labs  Lab 05/23/23 2226  NA 138  K 3.6  CL 104  CO2 27  GLUCOSE 106*  BUN 21*  CREATININE 1.51*  CALCIUM 9.4   Liver Function Tests: Recent Labs  Lab 05/23/23 2226  AST 71*  ALT 53*  ALKPHOS 60  BILITOT 0.8  PROT 7.8  ALBUMIN 4.5   No results for input(s): "LIPASE", "AMYLASE" in the last 168 hours. No results for input(s): "AMMONIA" in the last 168 hours. CBC: Recent Labs  Lab 05/23/23 2226  WBC 5.5  NEUTROABS 3.2  HGB 14.1  HCT 45.4  MCV 75.3*  PLT 125*   Cardiac Enzymes: No results for input(s): "CKTOTAL", "CKMB", "CKMBINDEX", "TROPONINI" in the last 168 hours.  BNP (last 3 results) No results for input(s): "BNP" in the last 8760 hours.  ProBNP (last 3 results) No results for input(s): "PROBNP" in the last 8760 hours.  CBG: Recent Labs  Lab 05/23/23 2321  GLUCAP 117*    Radiological Exams on Admission: MR BRAIN WO CONTRAST  Result Date: 05/24/2023 CLINICAL DATA:  TIA EXAM: MRI HEAD WITHOUT CONTRAST TECHNIQUE: Multiplanar, multiecho pulse sequences of the brain and surrounding structures were obtained without intravenous contrast. COMPARISON:  Head CT from yesterday FINDINGS: Brain: Band of restricted diffusion at the left basal ganglia and corona radiata. No hemorrhage, hydrocephalus, mass, or collection. Normal brain volume. Rare remote white matter insults. Vascular: Normal flow voids. Skull and upper cervical spine: Normal marrow signal. Sinuses/Orbits: Negative IMPRESSION: Acute perforator infarct affecting the left basal ganglia. Electronically Signed   By: Tiburcio Pea M.D.   On: 05/24/2023 09:14   CT Head Wo Contrast  Result Date: 05/23/2023 CLINICAL DATA:  Paresthesias. EXAM: CT HEAD WITHOUT CONTRAST TECHNIQUE: Contiguous axial images were obtained from the base of the skull through the vertex  without intravenous contrast. RADIATION DOSE REDUCTION: This exam was performed according to the departmental dose-optimization program which includes automated exposure control, adjustment of the mA and/or kV according to patient size and/or use of iterative reconstruction technique. COMPARISON:  None Available. FINDINGS: Brain: No evidence of acute infarction, hemorrhage, hydrocephalus, extra-axial collection or mass lesion/mass effect. There is a small focal hypodensity in the left basal ganglia measuring 5 x 5 mm. Vascular: No hyperdense vessel or unexpected calcification. Skull: Normal. Negative for fracture or focal lesion. Sinuses/Orbits: No acute finding. Other: None. IMPRESSION: 1. No acute intracranial process. 2. Small focal hypodensity in the left basal ganglia measuring 5 x 5 mm. This may represent an old lacunar infarct versus prominent perivascular space. Electronically Signed   By: Darliss Cheney M.D.   On: 05/23/2023 22:36    Assessment/Plan This is a pleasant and active healthy 52 year old gentleman being admitted to the hospital with acute basal ganglia ischemic stroke.  Symptoms were self-limiting, and he is  now back to normal.  Acute basal ganglia stroke-he is without significant stroke risk, however I do suspect that he may have some underlying unrecognized hypertension. -Inpatient admission to [facility] -Continuous telemetry -Daily aspirin -N.p.o. until nurse bedside swallow screen, with SLP evaluation if needed  -PT/OT -Permissive hypertension for the next 24 hours, though blood pressure is normal -Check fasting lipids and hemoglobin A1c -2D echo -CTA head and neck with and without contrast -ER provider discussed with Dr. Selina Cooley, who will consult formally at [facility]  AKI-unknown baseline renal function.  I wonder if he has unrecognized chronic hypertension which may explain his stroke, and perhaps renal insufficiency if this is CKD. -Avoid nephrotoxins -Check urinalysis and renal  ultrasound to rule out obstructive disease -Hydrate with IV fluids though he does not look particularly dry -Follow renal function with daily labs  Thrombocytopenia-this appears to be chronic -Follow daily -Would likely benefit from outpatient hematology consult  Microcytosis-without anemia, though he may be hemoconcentrated -Iron studies  DVT prophylaxis: Lovenox     Code Status: Full Code  Consults called: Neurology Dr. Selina Cooley  Admission status: The appropriate patient status for this patient is INPATIENT. Inpatient status is judged to be reasonable and necessary in order to provide the required intensity of service to ensure the patient's safety. The patient's presenting symptoms, physical exam findings, and initial radiographic and laboratory data in the context of their chronic comorbidities is felt to place them at high risk for further clinical deterioration. Furthermore, it is not anticipated that the patient will be medically stable for discharge from the hospital within 2 midnights of admission.    I certify that at the point of admission it is my clinical judgment that the patient will require inpatient hospital care spanning beyond 2 midnights from the point of admission due to high intensity of service, high risk for further deterioration and high frequency of surveillance required  Time spent: 53 minutes  Thy Gullikson Sharlette Dense MD [facility] Pager [phone]  If 7PM-7AM, please contact night-coverage [email or web address] Password [facility]  05/24/2023, 10:37 AM

## 2023-05-24 NOTE — ED Notes (Signed)
[facility] called. 

## 2023-05-25 ENCOUNTER — Inpatient Hospital Stay (HOSPITAL_COMMUNITY): Payer: 59

## 2023-05-25 DIAGNOSIS — E785 Hyperlipidemia, unspecified: Secondary | ICD-10-CM | POA: Insufficient documentation

## 2023-05-25 DIAGNOSIS — R4781 Slurred speech: Secondary | ICD-10-CM

## 2023-05-25 DIAGNOSIS — I6389 Other cerebral infarction: Secondary | ICD-10-CM

## 2023-05-25 DIAGNOSIS — I639 Cerebral infarction, unspecified: Secondary | ICD-10-CM

## 2023-05-25 DIAGNOSIS — E669 Obesity, unspecified: Secondary | ICD-10-CM | POA: Insufficient documentation

## 2023-05-25 DIAGNOSIS — R030 Elevated blood-pressure reading, without diagnosis of hypertension: Secondary | ICD-10-CM | POA: Insufficient documentation

## 2023-05-25 LAB — RAPID URINE DRUG SCREEN, HOSP PERFORMED
Amphetamines: NOT DETECTED
Barbiturates: NOT DETECTED
Benzodiazepines: NOT DETECTED
Cocaine: NOT DETECTED
Opiates: NOT DETECTED
Tetrahydrocannabinol: NOT DETECTED

## 2023-05-25 LAB — LIPID PANEL
Cholesterol: 162 mg/dL (ref 0–200)
HDL: 54 mg/dL (ref >40)
LDL Cholesterol: 97 mg/dL (ref 0–99)
Total CHOL/HDL Ratio: 3 RATIO
Triglycerides: 56 mg/dL (ref <150)
VLDL: 11 mg/dL (ref 0–40)

## 2023-05-25 LAB — ECHOCARDIOGRAM COMPLETE BUBBLE STUDY
Area-P 1/2: 3.65 cm2
S' Lateral: 3.1 cm

## 2023-05-25 MED ORDER — CLOPIDOGREL BISULFATE 75 MG PO TABS: 75.0000 mg | ORAL_TABLET | Freq: Every day | ORAL | 0 refills | Status: DC

## 2023-05-25 MED ORDER — ASPIRIN 81 MG PO TBEC: 81.0000 mg | DELAYED_RELEASE_TABLET | Freq: Every day | ORAL | Status: DC

## 2023-05-25 MED ORDER — ASPIRIN 81 MG PO TBEC: 81.0000 mg | DELAYED_RELEASE_TABLET | Freq: Every day | ORAL | 12 refills | Status: AC

## 2023-05-25 MED ORDER — CLOPIDOGREL BISULFATE 75 MG PO TABS
75.0000 mg | ORAL_TABLET | Freq: Every day | ORAL | Status: DC
  Administered 2023-05-25: 75 mg via ORAL
  Filled 2023-05-25: qty 1

## 2023-05-25 MED ORDER — ATORVASTATIN CALCIUM 40 MG PO TABS
40.0000 mg | ORAL_TABLET | Freq: Every day | ORAL | Status: DC
  Administered 2023-05-25: 40 mg via ORAL
  Filled 2023-05-25: qty 1

## 2023-05-25 MED ORDER — ATORVASTATIN CALCIUM 40 MG PO TABS: 40.0000 mg | ORAL_TABLET | Freq: Every day | ORAL | 1 refills | Status: DC

## 2023-05-25 NOTE — Discharge Summary (Signed)
Physician Discharge Summary  Steven Ramsey OZH:[id number] DOB: Mar 07, 1971 DOA: 05/23/2023  PCP: Ollen Bowl, MD  Admit date: 05/23/2023 Discharge date: 05/25/2023 Admitted From: Home Disposition: Home Recommendations for Outpatient Follow-up:  Follow up with PCP in 1 to 2 weeks Neurology to arrange outpatient follow-up Check blood pressure at follow-up Please follow up on the following pending results: None  Home Health: Ambulatory referral to speech therapy Equipment/Devices: None  Discharge Condition: Stable CODE STATUS: Full code  Follow-up Information     [facility]. Schedule an appointment as soon as possible for a visit.   Specialty: Rehabilitation Contact information: [address] [place] [address] [phone]        Ollen Bowl, MD. Schedule an appointment as soon as possible for a visit in 1 week(s).   Specialty: Internal Medicine Contact information: [address] [place] [address] [phone]         [facility]. Schedule an appointment as soon as possible for a visit in 1 month(s).   Specialty: Neurology Why: stroke clinic Contact information: [address] [place] [address] [phone]                Hospital course 52 year old M with no significant PMH presenting with episode of left facial numbness and slurred speech.  Symptoms lasted all day and resolved.  CT head and MRI brain showed left basal ganglia infarct.  Blood pressure elevated.  CT angio head and neck without acute finding.  Neurology consulted and recommended admission to [facility]  Patient was started on Plavix and aspirin.   The next day, echocardiogram with bubble study concerning for intra-atrial shunting.  Neurology performed transcranial Doppler.  Per neurology, TCD done, no PFO, they told me the echo bubble  probably from double injection with some delayed intrapulmonary shunt, likely not PFO.  A1c 6.0%.  LDL 97.  Started on Lipitor 40 mg daily.  Speech therapy recommended outpatient SLP.  No PT/OT need identified.  Cleared for discharge by neurology on aspirin and Plavix for 3 weeks followed by aspirin alone.  Also started on Lipitor 40 mg daily.  See individual problem list below for more.   Problems addressed during this hospitalization Principal Problem:   Acute CVA (cerebrovascular accident) (HCC) Elevated blood pressure-improved and normotensive.  Reassess at follow-up Hyperlipidemia-continue Lipitor Prediabetes-lifestyle change Obesity-lifestyle change Body mass index is 30.34 kg/m.             Time spent 35 minutes  Vital signs Vitals:   05/25/23 0645 05/25/23 0732 05/25/23 1121 05/25/23 1600  BP: 138/72 136/86 (!) 142/85   Pulse: 62 66 70   Temp: 98 F (36.7 C) 98.3 F (36.8 C) 98.3 F (36.8 C) Comment: off unit  Resp: 14 16 18    Height:      Weight:      SpO2: 99% 100% 100%   TempSrc: Oral Oral Oral   BMI (Calculated):         Discharge exam  GENERAL: No apparent distress.  Nontoxic. HEENT: MMM.  Vision and hearing grossly intact.  NECK: Supple.  No apparent JVD.  RESP:  No IWOB.  Fair aeration bilaterally. CVS:  RRR. Heart sounds normal.  ABD/GI/GU: BS+. Abd soft, NTND.  MSK/EXT:  Moves extremities. No apparent deformity. No edema.  SKIN: no apparent skin lesion or wound NEURO: Awake and alert. Oriented appropriately.  No  apparent focal neuro deficit. PSYCH: Calm. Normal affect.   Discharge Instructions Discharge Instructions     Ambulatory referral to Neurology   Complete by: As directed    Follow up with stroke clinic NP at [facility] in about 4 weeks. Thanks.   Ambulatory referral to Speech Therapy   Complete by: As directed    Diet - low sodium heart healthy   Complete by: As directed    Discharge instructions   Complete by: As directed    It has  been a pleasure taking care of you!  You were hospitalized due to stroke.  You have been started on medications to reduce your risk of stroke in the future.  It is very important that you take your medications as prescribed.  Follow-up with your primary care doctor in 1 to 2 weeks or sooner if needed.  Follow-up with GI as per their recommendation.   Take care,   Increase activity slowly   Complete by: As directed       Allergies as of 05/25/2023   No Known Allergies      Medication List     STOP taking these medications    methocarbamol 500 MG tablet Commonly known as: Robaxin   oxyCODONE-acetaminophen 5-325 MG tablet Commonly known as: PERCOCET/ROXICET   ProAir HFA 108 (90 Base) MCG/ACT inhaler Generic drug: albuterol       TAKE these medications    amoxicillin 500 MG capsule Commonly known as: AMOXIL TAKE 4 TABLETS BY MOUTH 1 HOUR PRIOR TO DENTAL PROCEDURE   aspirin EC 81 MG tablet Take 1 tablet (81 mg total) by mouth daily. Swallow whole. Start taking on: May 26, 2023 What changed:  medication strength how much to take when to take this additional instructions   atorvastatin 40 MG tablet Commonly known as: LIPITOR Take 1 tablet (40 mg total) by mouth daily. Start taking on: May 26, 2023   clopidogrel 75 MG tablet Commonly known as: PLAVIX Take 1 tablet (75 mg total) by mouth daily. Start taking on: May 26, 2023   gabapentin 300 MG capsule Commonly known as: Neurontin Take 1 capsule (300 mg total) by mouth 3 (three) times daily.        Consultations: Neurology  Procedures/Studies:   VAS [place] TRANSCRANIAL DOPPLER W BUBBLES  Result Date: 05/25/2023  Transcranial Doppler with Bubble Patient Name:  Steven Ramsey  Date of Exam:   05/25/2023 Medical Rec #: [id number]          Accession #:    [id number] Date of Birth: [birth year]          Patient Gender: M Patient Age:   52 years Exam Location:  [facility] Procedure:      VAS [place]  TRANSCRANIAL DOPPLER W BUBBLES Referring Phys: Scheryl Marten XU --------------------------------------------------------------------------------  Indications: Stroke. History: Questionable PFO by transthoracic echo. Acute left basal ganglia perforator infarction. Comparison Study: No prior study on file Performing Technologist: Sherren Kerns RVS  Examination Guidelines: A complete evaluation includes B-mode imaging, spectral Doppler, color Doppler, and power Doppler as needed of all accessible portions of each vessel. Bilateral testing is considered an integral part of a complete examination. Limited examinations for reoccurring indications may be performed as noted.  Summary: No HITS at rest or during Valsalva. Negative transcranial Doppler Bubble study with no evidence of right to left intracardiac communication.    Diagnosing physician: Marvel Plan MD Electronically signed by Marvel Plan MD on 05/25/2023 at 4:17:50 PM.    Final  ECHOCARDIOGRAM COMPLETE BUBBLE STUDY  Result Date: 05/25/2023    ECHOCARDIOGRAM REPORT   Patient Name:   DEMITRUS HARPIN Fausto Date of Exam: 05/25/2023 Medical Rec #:  [id number]         Height:       73.0 in Accession #:    [id number]        Weight:       230.0 lb Date of Birth:  [birth year]         BSA:          2.283 m Patient Age:    52 years          BP:           142/85 mmHg Patient Gender: M                 HR:           74 bpm. Exam Location:  Inpatient Procedure: 2D Echo, Limited Color Doppler and Color Doppler Indications:    stroke  History:        Patient has no prior history of Echocardiogram examinations.  Sonographer:    Delcie Roch RDCS Referring Phys: [id number] [facility] IMPRESSIONS  1. Left ventricular ejection fraction, by estimation, is 60 to 65%. The left ventricle has normal function. The left ventricle has no regional wall motion abnormalities. There is mild concentric left ventricular hypertrophy. Left ventricular diastolic parameters were normal.  2. Right  ventricular systolic function is normal. The right ventricular size is normal.  3. The mitral valve is normal in structure. No evidence of mitral valve regurgitation. No evidence of mitral stenosis.  4. The aortic valve is tricuspid. Aortic valve regurgitation is not visualized. Aortic valve sclerosis is present, with no evidence of aortic valve stenosis.  5. The inferior vena cava is normal in size with greater than 50% respiratory variability, suggesting right atrial pressure of 3 mmHg.  6. Agitated saline contrast bubble study was positive with shunting observed within 3-6 cardiac cycles suggestive of interatrial shunt. Comparison(s): No prior Echocardiogram. FINDINGS  Left Ventricle: Left ventricular ejection fraction, by estimation, is 60 to 65%. The left ventricle has normal function. The left ventricle has no regional wall motion abnormalities. The left ventricular internal cavity size was normal in size. There is  mild concentric left ventricular hypertrophy. Left ventricular diastolic parameters were normal. Right Ventricle: The right ventricular size is normal. Right ventricular systolic function is normal. Left Atrium: Left atrial size was normal in size. Right Atrium: Right atrial size was normal in size. Pericardium: There is no evidence of pericardial effusion. Mitral Valve: The mitral valve is normal in structure. No evidence of mitral valve regurgitation. No evidence of mitral valve stenosis. Tricuspid Valve: The tricuspid valve is normal in structure. Tricuspid valve regurgitation is not demonstrated. No evidence of tricuspid stenosis. Aortic Valve: The aortic valve is tricuspid. Aortic valve regurgitation is not visualized. Aortic valve sclerosis is present, with no evidence of aortic valve stenosis. Pulmonic Valve: The pulmonic valve was normal in structure. Pulmonic valve regurgitation is not visualized. No evidence of pulmonic stenosis. Aorta: The aortic root is normal in size and structure.  Venous: The inferior vena cava is normal in size with greater than 50% respiratory variability, suggesting right atrial pressure of 3 mmHg. IAS/Shunts: No atrial level shunt detected by color flow Doppler. Agitated saline contrast was given intravenously to evaluate for intracardiac shunting. Agitated saline contrast bubble study was positive with shunting observed within 3-6 cardiac cycles suggestive  of interatrial shunt.  LEFT VENTRICLE PLAX 2D LVIDd:         4.70 cm   Diastology LVIDs:         3.10 cm   LV e' medial:    10.10 cm/s LV PW:         1.30 cm   LV E/e' medial:  6.6 LV IVS:        1.20 cm   LV e' lateral:   13.80 cm/s LVOT diam:     2.30 cm   LV E/e' lateral: 4.8 LV SV:         75 LV SV Index:   33 LVOT Area:     4.15 cm  RIGHT VENTRICLE             IVC RV Basal diam:  3.00 cm     IVC diam: 1.90 cm RV S prime:     12.60 cm/s TAPSE (M-mode): 2.5 cm LEFT ATRIUM             Index        RIGHT ATRIUM           Index LA diam:        3.70 cm 1.62 cm/m   RA Area:     17.70 cm LA Vol (A2C):   73.0 ml 31.97 ml/m  RA Volume:   49.00 ml  21.46 ml/m LA Vol (A4C):   59.9 ml 26.23 ml/m LA Biplane Vol: 66.8 ml 29.26 ml/m  AORTIC VALVE LVOT Vmax:   97.50 cm/s LVOT Vmean:  65.500 cm/s LVOT VTI:    0.181 m  AORTA Ao Root diam: 3.60 cm Ao Asc diam:  3.60 cm MITRAL VALVE MV Area (PHT): 3.65 cm    SHUNTS MV Decel Time: 208 msec    Systemic VTI:  0.18 m MV E velocity: 66.60 cm/s  Systemic Diam: 2.30 cm MV A velocity: 64.50 cm/s MV E/A ratio:  1.03 Olga Millers MD Electronically signed by Olga Millers MD Signature Date/Time: 05/25/2023/2:12:07 PM    Final    US RENAL  Result Date: 05/24/2023 CLINICAL DATA:  Acute renal insufficiency. EXAM: RENAL / URINARY TRACT ULTRASOUND COMPLETE COMPARISON:  None Available. FINDINGS: Examination difficult due to overlying bowel gas. Right Kidney: Renal measurements: 10.1 x 4.2 x 4.5 cm = volume: 100 mL. Echogenicity within normal limits. No mass or hydronephrosis visualized.  Left Kidney: Renal measurements: 11.2 x 7.3 x 6.2 cm = volume: 265 mL. Echogenicity within normal limits. No mass or hydronephrosis visualized. Bladder: Appears normal for degree of bladder distention. Bilateral ureteral jets visualized. Other: None. IMPRESSION: Normal renal ultrasound. Electronically Signed   By: Elberta Fortis M.D.   On: 05/24/2023 12:12   CT ANGIO HEAD NECK W WO CM  Result Date: 05/24/2023 CLINICAL DATA:  Stroke/TIA, determine embolic source. Left-sided facial numbness and altered speech. EXAM: CT ANGIOGRAPHY HEAD AND NECK WITH AND WITHOUT CONTRAST TECHNIQUE: Multidetector CT imaging of the head and neck was performed using the standard protocol during bolus administration of intravenous contrast. Multiplanar CT image reconstructions and MIPs were obtained to evaluate the vascular anatomy. Carotid stenosis measurements (when applicable) are obtained utilizing NASCET criteria, using the distal internal carotid diameter as the denominator. RADIATION DOSE REDUCTION: This exam was performed according to the departmental dose-optimization program which includes automated exposure control, adjustment of the mA and/or kV according to patient size and/or use of iterative reconstruction technique. CONTRAST:  75mL OMNIPAQUE IOHEXOL 350 MG/ML SOLN COMPARISON:  Head CT  05/23/2023.  MRI brain 05/24/2023. FINDINGS: CT HEAD FINDINGS Brain: No acute hemorrhage. Similar appearance of acute infarct in the left basal ganglia. No significant mass effect or midline shift. No new loss of gray-white differentiation. No hydrocephalus or extra-axial collection. Vascular: No hyperdense vessel or unexpected calcification. Skull: No calvarial fracture or suspicious bone lesion. Skull base is unremarkable. Sinuses/Orbits: No acute finding. Other: None. Review of the MIP images confirms the above findings CTA NECK FINDINGS Aortic arch: Two-vessel arch configuration with common origin of the right brachiocephalic and left  common carotid arteries. Arch vessel origins are patent. Right carotid system: No evidence of dissection, stenosis (50% or greater), or occlusion. Left carotid system: No evidence of dissection, stenosis (50% or greater), or occlusion. Vertebral arteries: Codominant. No evidence of dissection, stenosis (50% or greater), or occlusion. Skeleton: Unremarkable. Other neck: Unremarkable. Upper chest: Unremarkable. Review of the MIP images confirms the above findings CTA HEAD FINDINGS Anterior circulation: Calcified plaque along the carotid siphons without hemodynamically significant stenosis. The proximal ACAs and MCAs are patent without stenosis or aneurysm. Distal branches are symmetric. Posterior circulation: Normal basilar artery. The SCAs, AICAs and PICAs are patent proximally. The PCAs are patent proximally without stenosis or aneurysm. Distal branches are symmetric. Venous sinuses: Patent. Anatomic variants: Persistent fetal origin of the left PCA with hypoplastic left P1 segment. Review of the MIP images confirms the above findings IMPRESSION: 1. Similar appearance of acute infarct in the left basal ganglia. No acute hemorrhage. 2. No large vessel occlusion, hemodynamically significant stenosis, or aneurysm in the head or neck. Electronically Signed   By: Orvan Falconer M.D.   On: 05/24/2023 11:20   MR BRAIN WO CONTRAST  Result Date: 05/24/2023 CLINICAL DATA:  TIA EXAM: MRI HEAD WITHOUT CONTRAST TECHNIQUE: Multiplanar, multiecho pulse sequences of the brain and surrounding structures were obtained without intravenous contrast. COMPARISON:  Head CT from yesterday FINDINGS: Brain: Band of restricted diffusion at the left basal ganglia and corona radiata. No hemorrhage, hydrocephalus, mass, or collection. Normal brain volume. Rare remote white matter insults. Vascular: Normal flow voids. Skull and upper cervical spine: Normal marrow signal. Sinuses/Orbits: Negative IMPRESSION: Acute perforator infarct affecting  the left basal ganglia. Electronically Signed   By: Tiburcio Pea M.D.   On: 05/24/2023 09:14   CT Head Wo Contrast  Result Date: 05/23/2023 CLINICAL DATA:  Paresthesias. EXAM: CT HEAD WITHOUT CONTRAST TECHNIQUE: Contiguous axial images were obtained from the base of the skull through the vertex without intravenous contrast. RADIATION DOSE REDUCTION: This exam was performed according to the departmental dose-optimization program which includes automated exposure control, adjustment of the mA and/or kV according to patient size and/or use of iterative reconstruction technique. COMPARISON:  None Available. FINDINGS: Brain: No evidence of acute infarction, hemorrhage, hydrocephalus, extra-axial collection or mass lesion/mass effect. There is a small focal hypodensity in the left basal ganglia measuring 5 x 5 mm. Vascular: No hyperdense vessel or unexpected calcification. Skull: Normal. Negative for fracture or focal lesion. Sinuses/Orbits: No acute finding. Other: None. IMPRESSION: 1. No acute intracranial process. 2. Small focal hypodensity in the left basal ganglia measuring 5 x 5 mm. This may represent an old lacunar infarct versus prominent perivascular space. Electronically Signed   By: Darliss Cheney M.D.   On: 05/23/2023 22:36       The results of significant diagnostics from this hospitalization (including imaging, microbiology, ancillary and laboratory) are listed below for reference.     Microbiology: No results found for this or any previous  visit (from the past 240 hour(s)).   Labs:  CBC: Recent Labs  Lab 05/23/23 2226  WBC 5.5  NEUTROABS 3.2  HGB 14.1  HCT 45.4  MCV 75.3*  PLT 125*   BMP &GFR Recent Labs  Lab 05/23/23 2226  NA 138  K 3.6  CL 104  CO2 27  GLUCOSE 106*  BUN 21*  CREATININE 1.51*  CALCIUM 9.4   Estimated Creatinine Clearance: 71.8 mL/min (A) (by C-G formula based on SCr of 1.51 mg/dL (H)). Liver & Pancreas: Recent Labs  Lab 05/23/23 2226  AST 71*   ALT 53*  ALKPHOS 60  BILITOT 0.8  PROT 7.8  ALBUMIN 4.5   No results for input(s): "LIPASE", "AMYLASE" in the last 168 hours. No results for input(s): "AMMONIA" in the last 168 hours. Diabetic: Recent Labs    05/24/23 1255  HGBA1C 6.0*   Recent Labs  Lab 05/23/23 2321  GLUCAP 117*   Cardiac Enzymes: No results for input(s): "CKTOTAL", "CKMB", "CKMBINDEX", "TROPONINI" in the last 168 hours. No results for input(s): "PROBNP" in the last 8760 hours. Coagulation Profile: Recent Labs  Lab 05/23/23 2226  INR 1.0   Thyroid Function Tests: No results for input(s): "TSH", "T4TOTAL", "FREET4", "T3FREE", "THYROIDAB" in the last 72 hours. Lipid Profile: Recent Labs    05/25/23 0801  CHOL 162  HDL 54  LDLCALC 97  TRIG 56  CHOLHDL 3.0   Anemia Panel: Recent Labs    05/24/23 1255  VITAMINB12 392  FOLATE 10.7  FERRITIN 139  TIBC 319  IRON 72  RETICCTPCT 1.2   Urine analysis:    Component Value Date/Time   COLORURINE YELLOW 05/24/2023 1416   APPEARANCEUR CLEAR 05/24/2023 1416   LABSPEC >1.046 (H) 05/24/2023 1416   PHURINE 6.0 05/24/2023 1416   GLUCOSEU NEGATIVE 05/24/2023 1416   HGBUR NEGATIVE 05/24/2023 1416   BILIRUBINUR NEGATIVE 05/24/2023 1416   KETONESUR NEGATIVE 05/24/2023 1416   PROTEINUR NEGATIVE 05/24/2023 1416   NITRITE NEGATIVE 05/24/2023 1416   LEUKOCYTESUR NEGATIVE 05/24/2023 1416   Sepsis Labs: Invalid input(s): "PROCALCITONIN", "LACTICIDVEN"   SIGNED:  Almon Hercules, MD  [facility] 05/25/2023, 4:21 PM

## 2023-05-25 NOTE — Progress Notes (Signed)
VASCULAR LAB    TCD bubble study has been performed.  See CV proc for preliminary results.   Stpehanie Montroy, RVT 05/25/2023, 3:58 PM

## 2023-05-25 NOTE — Progress Notes (Signed)
  Echocardiogram 2D Echocardiogram has been performed.  Delcie Roch 05/25/2023, 2:10 PM

## 2023-05-25 NOTE — Plan of Care (Signed)
Problem: Education: Goal: Knowledge of disease or condition will improve 05/25/2023 0649 by Anda Kraft, RN Outcome: Progressing 05/25/2023 0101 by Anda Kraft, RN Outcome: Progressing Goal: Knowledge of secondary prevention will improve (MUST DOCUMENT ALL) 05/25/2023 0649 by Anda Kraft, RN Outcome: Progressing 05/25/2023 0101 by Anda Kraft, RN Outcome: Progressing Goal: Knowledge of patient specific risk factors will improve Loraine Leriche N/A or DELETE if not current risk factor) 05/25/2023 0649 by Anda Kraft, RN Outcome: Progressing 05/25/2023 0101 by Anda Kraft, RN Outcome: Progressing   Problem: Ischemic Stroke/TIA Tissue Perfusion: Goal: Complications of ischemic stroke/TIA will be minimized 05/25/2023 0649 by Anda Kraft, RN Outcome: Progressing 05/25/2023 0101 by Anda Kraft, RN Outcome: Progressing   Problem: Coping: Goal: Will verbalize positive feelings about self 05/25/2023 0649 by Anda Kraft, RN Outcome: Progressing 05/25/2023 0101 by Anda Kraft, RN Outcome: Progressing Goal: Will identify appropriate support needs 05/25/2023 0649 by Anda Kraft, RN Outcome: Progressing 05/25/2023 0101 by Anda Kraft, RN Outcome: Progressing   Problem: Health Behavior/Discharge Planning: Goal: Ability to manage health-related needs will improve 05/25/2023 0649 by Anda Kraft, RN Outcome: Progressing 05/25/2023 0101 by Anda Kraft, RN Outcome: Progressing Goal: Goals will be collaboratively established with patient/family 05/25/2023 [phone] by Anda Kraft, RN Outcome: Progressing 05/25/2023 0101 by Anda Kraft, RN Outcome: Progressing   Problem: Self-Care: Goal: Ability to participate in self-care as condition permits will improve 05/25/2023 0649 by Anda Kraft, RN Outcome: Progressing 05/25/2023 0101 by Anda Kraft, RN Outcome: Progressing Goal: Verbalization of feelings and concerns over difficulty with self-care will improve 05/25/2023 0649 by Anda Kraft, RN Outcome: Progressing 05/25/2023 0101 by Anda Kraft, RN Outcome: Progressing Goal: Ability to communicate needs accurately will improve 05/25/2023 0649 by Anda Kraft, RN Outcome: Progressing 05/25/2023 0101 by Anda Kraft, RN Outcome: Progressing   Problem: Nutrition: Goal: Risk of aspiration will decrease 05/25/2023 0649 by Anda Kraft, RN Outcome: Progressing 05/25/2023 0101 by Anda Kraft, RN Outcome: Progressing Goal: Dietary intake will improve 05/25/2023 0649 by Anda Kraft, RN Outcome: Progressing 05/25/2023 0101 by Anda Kraft, RN Outcome: Progressing   Problem: Education: Goal: Knowledge of General Education information will improve Description: Including pain rating scale, medication(s)/side effects and non-pharmacologic comfort measures 05/25/2023 0649 by Anda Kraft, RN Outcome: Progressing 05/25/2023 0101 by Anda Kraft, RN Outcome: Progressing   Problem: Health Behavior/Discharge Planning: Goal: Ability to manage health-related needs will improve 05/25/2023 0649 by Anda Kraft, RN Outcome: Progressing 05/25/2023 0101 by Anda Kraft, RN Outcome: Progressing   Problem: Clinical Measurements: Goal: Ability to maintain clinical measurements within normal limits will improve 05/25/2023 0649 by Anda Kraft, RN Outcome: Progressing 05/25/2023 0101 by Anda Kraft, RN Outcome: Progressing Goal: Will remain free from infection 05/25/2023 0649 by Anda Kraft, RN Outcome: Progressing 05/25/2023 0101 by Anda Kraft, RN Outcome: Progressing Goal: Diagnostic test results will improve 05/25/2023 0649 by Anda Kraft, RN Outcome: Progressing 05/25/2023 0101 by Anda Kraft, RN Outcome: Progressing Goal: Respiratory complications will improve 05/25/2023 0649 by Anda Kraft, RN Outcome: Progressing 05/25/2023 0101 by Anda Kraft, RN Outcome: Progressing Goal: Cardiovascular complication will be avoided 05/25/2023 0649 by Anda Kraft, RN Outcome: Progressing 05/25/2023 0101 by Anda Kraft, RN Outcome: Progressing   Problem: Activity: Goal: Risk for activity intolerance will decrease 05/25/2023 0649 by Anda Kraft, RN Outcome: Progressing 05/25/2023 0101 by Anda Kraft, RN Outcome: Progressing   Problem: Nutrition: Goal: Adequate nutrition will be maintained 05/25/2023 0649 by Anda Kraft, RN Outcome: Progressing 05/25/2023 0101 by Anda Kraft, RN Outcome: Progressing   Problem: Coping: Goal: Level of  anxiety will decrease 05/25/2023 0649 by Anda Kraft, RN Outcome: Progressing 05/25/2023 0101 by Anda Kraft, RN Outcome: Progressing   Problem: Elimination: Goal: Will not experience complications related to bowel motility 05/25/2023 0649 by Anda Kraft, RN Outcome: Progressing 05/25/2023 0101 by Anda Kraft, RN Outcome: Progressing Goal: Will not experience complications related to urinary retention 05/25/2023 0649 by Anda Kraft, RN Outcome: Progressing 05/25/2023 0101 by Anda Kraft, RN Outcome: Progressing   Problem: Pain Managment: Goal: General experience of comfort will improve 05/25/2023 0649 by Anda Kraft, RN Outcome: Progressing 05/25/2023 0101 by Anda Kraft, RN Outcome: Progressing   Problem: Safety: Goal: Ability to remain free from injury will improve 05/25/2023 0649 by Anda Kraft, RN Outcome: Progressing 05/25/2023 0101 by Anda Kraft, RN Outcome: Progressing   Problem: Skin Integrity: Goal: Risk for impaired skin integrity will decrease 05/25/2023 0649 by Anda Kraft, RN Outcome: Progressing 05/25/2023 0101 by Anda Kraft, RN Outcome: Progressing

## 2023-05-25 NOTE — Progress Notes (Signed)
PROGRESS NOTE  Steven Ramsey JXB:[id number] DOB: May 30, 1971   PCP: Ollen Bowl, MD  Patient is from: Home.  DOA: 05/23/2023 LOS: 1  Chief complaints Chief Complaint  Patient presents with   Numbness    Left face    Aphasia     Brief Narrative / Interim history: 52 year old M with no significant PMH presenting with episode of left facial numbness and slurred speech.  Symptoms lasted all day and resolved.  CT head and MRI brain showed left basal ganglia infarct.  Blood pressure elevated.  CT angio head and neck without acute finding.  Neurology consulted and recommended admission to [facility].  Patient was started on Plavix and aspirin.  The next day, echocardiogram with bubble study concerning for intra-atrial shunting.  Neurology to perform transcranial Doppler.  A1c 6.0%.  LDL 97.  Started on Lipitor 40 mg daily.  Speech therapy recommended outpatient SLP.  No PT/OT need identified.   Subjective: Seen and examined earlier this morning.  No major events overnight of this morning.  Noted septal weakness with right hip flexion during evaluation by neurology.  Still with some numbness in left face.  No other complaints.  Objective: Vitals:   05/25/23 0012 05/25/23 0645 05/25/23 0732 05/25/23 1121  BP: (!) 120/57 138/72 136/86 (!) 142/85  Pulse: 71 62 66 70  Resp: 16 14 16 18   Temp: 98.3 F (36.8 C) 98 F (36.7 C) 98.3 F (36.8 C) 98.3 F (36.8 C)  TempSrc: Oral Oral Oral Oral  SpO2: 99% 99% 100% 100%  Weight:      Height:        Examination:  GENERAL: No apparent distress.  Nontoxic. HEENT: MMM.  Vision and hearing grossly intact.  NECK: Supple.  No apparent JVD.  RESP:  No IWOB.  Fair aeration bilaterally. CVS:  RRR. Heart sounds normal.  ABD/GI/GU: BS+. Abd soft, NTND.  MSK/EXT:  Moves extremities. No apparent deformity. No edema.  SKIN: no apparent skin lesion or wound NEURO: Awake, alert and oriented appropriately.  Some dysarthria noted.  Cranial  nerves II-XII intact. Motor 4+/5 with right hip flexion and 5/5 in all other muscle groups of UE and LE bilaterally, Normal tone. Light sensation intact in all dermatomes of upper and lower ext bilaterally. Patellar reflex symmetric.  No pronator drift.  Finger to nose intact. PSYCH: Calm. Normal affect.   Procedures:  None  Microbiology summarized: None  Assessment and plan: Principal Problem:   Acute CVA (cerebrovascular accident) (HCC)  Acute CVA: Presents with some left facial numbness and slurred speech.  CT and MRI brain showed left basal ganglia infarct which could contribute his slurred speech but not left facial numbness.  CT angio head and neck without acute finding.  TTE concerning for intra-atrial shunting.  A1c 6.0%.  LDL 97.  BP was elevated on presentation but improved.  Patient has no significant past medical history but has not seen PCP in 2 years. -Neurology on board -On Plavix and aspirin. -Start Lipitor 40 mg daily -Neurologist to perform TCD -SLP recommended outpatient SLP. -No PT/OT need identified.  Elevated blood pressure: Improved without medication. -Has upcoming appointment with PCP  Hyperlipidemia -Lipitor as above.  Prediabetes: A1c 6.0%. -Lifestyle change.  Obesity Body mass index is 30.34 kg/m. -Encourage lifestyle change.          DVT prophylaxis:  enoxaparin (LOVENOX) injection 40 mg Start: 05/24/23 1200 SCD's Start: 05/24/23 1026  Code Status: Full code Family Communication: Updated patient's wife at  bedside Level of care: Telemetry Medical Status is: Inpatient Remains inpatient appropriate because: Acute CVA evaluation   Final disposition: Home Consultants:  Neurology  35 minutes with more than 50% spent in reviewing records, counseling patient/family and coordinating care.   Sch Meds:  Scheduled Meds:  [START ON 05/26/2023] aspirin EC  81 mg Oral Daily   atorvastatin  40 mg Oral Daily   clopidogrel  75 mg Oral Daily    enoxaparin (LOVENOX) injection  40 mg Subcutaneous Q24H   Continuous Infusions:  sodium chloride Stopped (05/25/23 0220)   PRN Meds:.acetaminophen **OR** acetaminophen (TYLENOL) oral liquid 160 mg/5 mL **OR** acetaminophen, hydrALAZINE, ondansetron (ZOFRAN) IV, senna-docusate  Antimicrobials: Anti-infectives (From admission, onward)    None        I have personally reviewed the following labs and images: CBC: Recent Labs  Lab 05/23/23 2226  WBC 5.5  NEUTROABS 3.2  HGB 14.1  HCT 45.4  MCV 75.3*  PLT 125*   BMP &GFR Recent Labs  Lab 05/23/23 2226  NA 138  K 3.6  CL 104  CO2 27  GLUCOSE 106*  BUN 21*  CREATININE 1.51*  CALCIUM 9.4   Estimated Creatinine Clearance: 71.8 mL/min (A) (by C-G formula based on SCr of 1.51 mg/dL (H)). Liver & Pancreas: Recent Labs  Lab 05/23/23 2226  AST 71*  ALT 53*  ALKPHOS 60  BILITOT 0.8  PROT 7.8  ALBUMIN 4.5   No results for input(s): "LIPASE", "AMYLASE" in the last 168 hours. No results for input(s): "AMMONIA" in the last 168 hours. Diabetic: Recent Labs    05/24/23 1255  HGBA1C 6.0*   Recent Labs  Lab 05/23/23 2321  GLUCAP 117*   Cardiac Enzymes: No results for input(s): "CKTOTAL", "CKMB", "CKMBINDEX", "TROPONINI" in the last 168 hours. No results for input(s): "PROBNP" in the last 8760 hours. Coagulation Profile: Recent Labs  Lab 05/23/23 2226  INR 1.0   Thyroid Function Tests: No results for input(s): "TSH", "T4TOTAL", "FREET4", "T3FREE", "THYROIDAB" in the last 72 hours. Lipid Profile: Recent Labs    05/25/23 0801  CHOL 162  HDL 54  LDLCALC 97  TRIG 56  CHOLHDL 3.0   Anemia Panel: Recent Labs    05/24/23 1255  VITAMINB12 392  FOLATE 10.7  FERRITIN 139  TIBC 319  IRON 72  RETICCTPCT 1.2   Urine analysis:    Component Value Date/Time   COLORURINE YELLOW 05/24/2023 1416   APPEARANCEUR CLEAR 05/24/2023 1416   LABSPEC >1.046 (H) 05/24/2023 1416   PHURINE 6.0 05/24/2023 1416    GLUCOSEU NEGATIVE 05/24/2023 1416   HGBUR NEGATIVE 05/24/2023 1416   BILIRUBINUR NEGATIVE 05/24/2023 1416   KETONESUR NEGATIVE 05/24/2023 1416   PROTEINUR NEGATIVE 05/24/2023 1416   NITRITE NEGATIVE 05/24/2023 1416   LEUKOCYTESUR NEGATIVE 05/24/2023 1416   Sepsis Labs: Invalid input(s): "PROCALCITONIN", "LACTICIDVEN"  Microbiology: No results found for this or any previous visit (from the past 240 hour(s)).  Radiology Studies: ECHOCARDIOGRAM COMPLETE BUBBLE STUDY  Result Date: 05/25/2023    ECHOCARDIOGRAM REPORT   Patient Name:   Steven Ramsey Date of Exam: 05/25/2023 Medical Rec #:  [id number]         Height:       73.0 in Accession #:    [id number]        Weight:       230.0 lb Date of Birth:  1971-01-23         BSA:          2.283 m Patient  Age:    52 years          BP:           142/85 mmHg Patient Gender: M                 HR:           74 bpm. Exam Location:  Inpatient Procedure: 2D Echo, Limited Color Doppler and Color Doppler Indications:    stroke  History:        Patient has no prior history of Echocardiogram examinations.  Sonographer:    Delcie Roch RDCS Referring Phys: [id number] [facility] IMPRESSIONS  1. Left ventricular ejection fraction, by estimation, is 60 to 65%. The left ventricle has normal function. The left ventricle has no regional wall motion abnormalities. There is mild concentric left ventricular hypertrophy. Left ventricular diastolic parameters were normal.  2. Right ventricular systolic function is normal. The right ventricular size is normal.  3. The mitral valve is normal in structure. No evidence of mitral valve regurgitation. No evidence of mitral stenosis.  4. The aortic valve is tricuspid. Aortic valve regurgitation is not visualized. Aortic valve sclerosis is present, with no evidence of aortic valve stenosis.  5. The inferior vena cava is normal in size with greater than 50% respiratory variability, suggesting right atrial pressure of 3 mmHg.  6.  Agitated saline contrast bubble study was positive with shunting observed within 3-6 cardiac cycles suggestive of interatrial shunt. Comparison(s): No prior Echocardiogram. FINDINGS  Left Ventricle: Left ventricular ejection fraction, by estimation, is 60 to 65%. The left ventricle has normal function. The left ventricle has no regional wall motion abnormalities. The left ventricular internal cavity size was normal in size. There is  mild concentric left ventricular hypertrophy. Left ventricular diastolic parameters were normal. Right Ventricle: The right ventricular size is normal. Right ventricular systolic function is normal. Left Atrium: Left atrial size was normal in size. Right Atrium: Right atrial size was normal in size. Pericardium: There is no evidence of pericardial effusion. Mitral Valve: The mitral valve is normal in structure. No evidence of mitral valve regurgitation. No evidence of mitral valve stenosis. Tricuspid Valve: The tricuspid valve is normal in structure. Tricuspid valve regurgitation is not demonstrated. No evidence of tricuspid stenosis. Aortic Valve: The aortic valve is tricuspid. Aortic valve regurgitation is not visualized. Aortic valve sclerosis is present, with no evidence of aortic valve stenosis. Pulmonic Valve: The pulmonic valve was normal in structure. Pulmonic valve regurgitation is not visualized. No evidence of pulmonic stenosis. Aorta: The aortic root is normal in size and structure. Venous: The inferior vena cava is normal in size with greater than 50% respiratory variability, suggesting right atrial pressure of 3 mmHg. IAS/Shunts: No atrial level shunt detected by color flow Doppler. Agitated saline contrast was given intravenously to evaluate for intracardiac shunting. Agitated saline contrast bubble study was positive with shunting observed within 3-6 cardiac cycles suggestive of interatrial shunt.  LEFT VENTRICLE PLAX 2D LVIDd:         4.70 cm   Diastology LVIDs:          3.10 cm   LV e' medial:    10.10 cm/s LV PW:         1.30 cm   LV E/e' medial:  6.6 LV IVS:        1.20 cm   LV e' lateral:   13.80 cm/s LVOT diam:     2.30 cm   LV E/e' lateral: 4.8  LV SV:         75 LV SV Index:   33 LVOT Area:     4.15 cm  RIGHT VENTRICLE             IVC RV Basal diam:  3.00 cm     IVC diam: 1.90 cm RV S prime:     12.60 cm/s TAPSE (M-mode): 2.5 cm LEFT ATRIUM             Index        RIGHT ATRIUM           Index LA diam:        3.70 cm 1.62 cm/m   RA Area:     17.70 cm LA Vol (A2C):   73.0 ml 31.97 ml/m  RA Volume:   49.00 ml  21.46 ml/m LA Vol (A4C):   59.9 ml 26.23 ml/m LA Biplane Vol: 66.8 ml 29.26 ml/m  AORTIC VALVE LVOT Vmax:   97.50 cm/s LVOT Vmean:  65.500 cm/s LVOT VTI:    0.181 m  AORTA Ao Root diam: 3.60 cm Ao Asc diam:  3.60 cm MITRAL VALVE MV Area (PHT): 3.65 cm    SHUNTS MV Decel Time: 208 msec    Systemic VTI:  0.18 m MV E velocity: 66.60 cm/s  Systemic Diam: 2.30 cm MV A velocity: 64.50 cm/s MV E/A ratio:  1.03 Olga Millers MD Electronically signed by Olga Millers MD Signature Date/Time: 05/25/2023/2:12:07 PM    Final       Seana Underwood T[facility]  If 7PM-7AM, please contact night-coverage [email or web address] 05/25/2023, 3:22 PM

## 2023-05-25 NOTE — Progress Notes (Signed)
Discharge instructions given. Patient verbalized understanding and all questions were answered.  ?

## 2023-05-25 NOTE — TOC Initial Note (Signed)
Transition of Care [facility]) - Initial/Assessment Note    Patient Details  Name: Steven Ramsey MRN: [id number] Date of Birth: [birth year]  [facility] CM/SW Contact:    Kermit Balo, RN Phone Number: 05/25/2023, 3:18 PM  Clinical Narrative:                 Pt is from home with his spouse. They are together most of the time.  Pt has DME at home but doesn't use any of it currently. Spouse manages his medications. They deny any issues.  Patient drives self as needed. Wife is able to provide transport.  Current recommendations are for outpatient ST. They prefer for him to attend at [facility]. CM has sent the referral. They will need to call and schedule first appointment. Information is on the AVS.  Wife will transport home when medically ready.  Expected Discharge Plan: OP Rehab Barriers to Discharge: Continued Medical Work up   Patient Goals and CMS Choice   CMS Medicare.gov Compare Post Acute Care list provided to:: Patient Choice offered to / list presented to : Patient, Spouse      Expected Discharge Plan and Services   Discharge Planning Services: CM Consult   Living arrangements for the past 2 months: Single Family Home                                      Prior Living Arrangements/Services Living arrangements for the past 2 months: Single Family Home Lives with:: Spouse Patient language and need for interpreter reviewed:: Yes Do you feel safe going back to the place where you live?: Yes        Care giver support system in place?: Yes (comment) Current home services: DME (cane/ crutches/ shower seat) Criminal Activity/Legal Involvement Pertinent to Current Situation/Hospitalization: No - Comment as needed  Activities of Daily Living      Permission Sought/Granted                  Emotional Assessment Appearance:: Appears stated age Attitude/Demeanor/Rapport: Engaged Affect (typically observed): Accepting Orientation: :  Oriented to Self, Oriented to Place, Oriented to  Time, Oriented to Situation   Psych Involvement: No (comment)  Admission diagnosis:  Slurred speech [R47.81] Acute CVA (cerebrovascular accident) (HCC) [I63.9] Numbness and tingling of left side of face [R20.0, R20.2] Patient Active Problem List   Diagnosis Date Noted   Acute CVA (cerebrovascular accident) (HCC) 05/24/2023   Primary osteoarthritis of right knee 10/28/2017   Osteoarthritis of right knee 10/27/2017   PCP:  Ollen Bowl, MD Pharmacy:   [id number] - [place], [place] - [address] [place] [address] Phone: [phone] Fax: [phone]  [facility] [id number] [place], [place] - [address] [place] [address] Phone: [phone] Fax: [phone]     Social Determinants of Health (SDOH) Social History: SDOH Screenings   Social Connections: Unknown (02/14/2022)   Received from [facility]  Tobacco Use: Low Risk  (05/23/2023)   SDOH Interventions:     Readmission Risk Interventions     No data to display

## 2023-05-25 NOTE — Consult Note (Signed)
NEUROLOGY CONSULTATION NOTE   Date of service: May 25, 2023 Patient Name: Steven Ramsey MRN:  [id number] DOB:  [birth year] Reason for consult: "facial numbness" Requesting Provider: Maryln Gottron, MD _ _ _   _ __   _ __ _ _  __ __   _ __   __ _  History of Present Illness  Steven Ramsey is a 52 y.o. male with no significant PMH who presens with episode of facial numbness and some slurring of his speech. Symptoms lasted all day and resolved. Came in to the ED where he had MRI Brain which was notable for left basal ganglia infarct.  Came from working out in the gym on Saturday when speech felt off. Noted that speech output was delayed. Talked to his wife but she felt pt was fine. Speech became slurred later and they decided to come to the ED.  CTA head and neck with no LVO or significant stenosis.  He was transferred to [facility] for further evaluation and workup.  LKW: 05/23/23 at 1130. mRS: 0 tNKASE: not offered, no symptoms. Thrombectomy: not offered, no symptoms. NIHSS components Score: Comment  1a Level of Conscious 0[x]  1[]  2[]  3[]      1b LOC Questions 0[x]  1[]  2[]       1c LOC Commands 0[x]  1[]  2[]       2 Best Gaze 0[x]  1[]  2[]       3 Visual 0[x]  1[]  2[]  3[]      4 Facial Palsy 0[x]  1[]  2[]  3[]      5a Motor Arm - left 0[x]  1[]  2[]  3[]  4[]  UN[]    5b Motor Arm - Right 0[x]  1[]  2[]  3[]  4[]  UN[]    6a Motor Leg - Left 0[x]  1[]  2[]  3[]  4[]  UN[]    6b Motor Leg - Right 0[x]  1[]  2[]  3[]  4[]  UN[]    7 Limb Ataxia 0[x]  1[]  2[]  3[]  UN[]     8 Sensory 0[x]  1[]  2[]  UN[]      9 Best Language 0[x]  1[]  2[]  3[]      10 Dysarthria 0[x]  1[]  2[]  UN[]      11 Extinct. and Inattention 0[x]  1[]  2[]       TOTAL: 0      ROS   Constitutional Denies weight loss, fever and chills.   HEENT Denies changes in vision and hearing.   Respiratory Denies SOB and cough.   CV Denies palpitations and CP   GI Denies abdominal pain, nausea, vomiting and diarrhea.   GU Denies dysuria and urinary frequency.    MSK Denies myalgia and joint pain.   Skin Denies rash and pruritus.   Neurological Denies headache and syncope.   Psychiatric Denies recent changes in mood. Denies anxiety and depression.    Past History   Past Medical History:  Diagnosis Date   Arthritis    Asthma    Past Surgical History:  Procedure Laterality Date   MENISCUS REPAIR Right    SHOULDER ARTHROSCOPY WITH ROTATOR CUFF REPAIR     Right   TOTAL KNEE ARTHROPLASTY Right 10/28/2017   TOTAL KNEE ARTHROPLASTY Right 10/28/2017   Procedure: RIGHT TOTAL KNEE ARTHROPLASTY;  Missey: Gean Birchwood, MD;  Location: [facility] OR;  Service: Orthopedics;  Laterality: Right;   WRIST SURGERY Left    History reviewed. No pertinent family history. Social History   Socioeconomic History   Marital status: Married    Spouse name: Not on file   Number of children: Not on file   Years of education: Not on file   Highest  education level: Not on file  Occupational History   Not on file  Tobacco Use   Smoking status: Never   Smokeless tobacco: Never  Vaping Use   Vaping status: Never Used  Substance and Sexual Activity   Alcohol use: No   Drug use: No   Sexual activity: Not on file  Other Topics Concern   Not on file  Social History Narrative   Not on file   Social Determinants of Health   Financial Resource Strain: Not on file  Food Insecurity: Not on file  Transportation Needs: Not on file  Physical Activity: Not on file  Stress: Not on file  Social Connections: Unknown (02/14/2022)   Received from [facility]   Social Network    Social Network: Not on file   No Known Allergies  Medications   Medications Prior to Admission  Medication Sig Dispense Refill Last Dose   amoxicillin (AMOXIL) 500 MG capsule TAKE 4 TABLETS BY MOUTH 1 HOUR PRIOR TO DENTAL PROCEDURE   unknown   aspirin EC 325 MG tablet Take 1 tablet (325 mg total) by mouth 2 (two) times daily. (Patient not taking: Reported on 05/24/2023) 30 tablet 0 Not Taking    gabapentin (NEURONTIN) 300 MG capsule Take 1 capsule (300 mg total) by mouth 3 (three) times daily. 90 capsule 2    methocarbamol (ROBAXIN) 500 MG tablet Take 1 tablet (500 mg total) by mouth 2 (two) times daily with a meal. (Patient not taking: Reported on 05/24/2023) 60 tablet 0 Not Taking   oxyCODONE-acetaminophen (PERCOCET/ROXICET) 5-325 MG tablet Take 1 tablet by mouth every 4 (four) hours as needed for severe pain. (Patient not taking: Reported on 05/24/2023) 30 tablet 0 Not Taking   PROAIR HFA 108 (90 BASE) MCG/ACT inhaler INHALE 2 PUFFS EVERY 6 HOURS AS NEEDED FOR COUGH OR WHEEZE *MAY USE 2 PUFFS 10-20 PRIOR TO EXERCISE (Patient not taking: Reported on 05/24/2023) 1 Inhaler 1 Not Taking     Vitals   Vitals:   05/24/23 0813 05/24/23 1354 05/24/23 1959 05/25/23 0012  BP: (!) 140/77 (!) 145/81 135/80 (!) 120/57  Pulse: 67 72 75 71  Resp: 15 16 15 16   Temp: 97.8 F (36.6 C) 97.8 F (36.6 C) 98.2 F (36.8 C) 98.3 F (36.8 C)  TempSrc: Oral Oral Oral Oral  SpO2: 98% 98% 100% 99%  Weight:      Height:         Body mass index is 30.34 kg/m.  Physical Exam   General: Laying comfortably in bed; in no acute distress.  HENT: Normal oropharynx and mucosa. Normal external appearance of ears and nose.  Neck: Supple, no pain or tenderness  CV: No JVD. No peripheral edema.  Pulmonary: Symmetric Chest rise. Normal respiratory effort.  Abdomen: Soft to touch, non-tender.  Ext: No cyanosis, edema, or deformity  Skin: No rash. Normal palpation of skin.   Musculoskeletal: Normal digits and nails by inspection. No clubbing.   Neurologic Examination  Mental status/Cognition: Alert, oriented to self, place, month and year, good attention.  Speech/language: Fluent, comprehension intact, object naming intact, repetition intact.  Cranial nerves:   CN II Pupils equal and reactive to light, no VF deficits    CN III,IV,VI EOM intact, no gaze preference or deviation, no nystagmus    CN V normal  sensation in V1, V2, and V3 segments bilaterally    CN VII no asymmetry, no nasolabial fold flattening    CN VIII normal hearing to speech  CN IX & X normal palatal elevation, no uvular deviation    CN XI 5/5 head turn and 5/5 shoulder shrug bilaterally    CN XII midline tongue protrusion    Motor:  Muscle bulk: normal, tone normal, pronator drift none tremor none Mvmt Root Nerve  Muscle Right Left Comments  SA C5/6 Ax Deltoid 5 5   EF C5/6 Mc Biceps 5 5   EE C6/7/8 Rad Triceps 5 5   WF C6/7 Med FCR     WE C7/8 PIN ECU     F Ab C8/T1 U ADM/FDI 5 5   HF L1/2/3 Fem Illopsoas 4+ 5   KE L2/3/4 Fem Quad 5 5   DF L4/5 D Peron Tib Ant 5 5   PF S1/2 Tibial Grc/Sol 5 5    Sensation:  Light touch Intact throughout   Pin prick    Temperature    Vibration   Proprioception    Coordination/Complex Motor:  - Finger to Nose intact throughout - Heel to shin intact - Rapid alternating movement intact BL - Gait: deferred.  Labs   CBC:  Recent Labs  Lab 05/23/23 2226  WBC 5.5  NEUTROABS 3.2  HGB 14.1  HCT 45.4  MCV 75.3*  PLT 125*    Basic Metabolic Panel:  Lab Results  Component Value Date   NA 138 05/23/2023   K 3.6 05/23/2023   CO2 27 05/23/2023   GLUCOSE 106 (H) 05/23/2023   BUN 21 (H) 05/23/2023   CREATININE 1.51 (H) 05/23/2023   CALCIUM 9.4 05/23/2023   GFRNONAA 55 (L) 05/23/2023   GFRAA >60 10/29/2017   Lipid Panel: No results found for: "LDLCALC" HgbA1c:  Lab Results  Component Value Date   HGBA1C 6.0 (H) 05/24/2023   Urine Drug Screen: No results found for: "LABOPIA", "COCAINSCRNUR", "LABBENZ", "AMPHETMU", "THCU", "LABBARB"  Alcohol Level     Component Value Date/Time   ETH <10 05/23/2023 2226    CT Head without contrast(Personally reviewed): CTH was negative for a large hypodensity concerning for a large territory infarct or hyperdensity concerning for an ICH  CT angio Head and Neck with contrast(Personally reviewed): No LVO  MRI  Brain(Personally reviewed): Left basal ganglia stroke  Impression   Steven Ramsey is a 52 y.o. male with no significant PMH who presens with episode of facial numbness and some slurring of his speech. Symptoms lasted all day and resolved. Came in to the ED where he had MRI Brain which was notable for left basal ganglia infarct.  Etiology of infarct is unclear, more embolic rather than small vessel.  Recommendations  - Frequent Neuro checks per stroke unit protocol - Recommend obtaining TTE - Recommend obtaining Lipid panel with LDL - Please start statin if LDL > 70 - Recommend HbA1c to evaluate for diabetes and how well it is controlled. - Antithrombotic - Aspirin 81mg  daily along with palvix 75mg  daily x 21 days, followed by Aspirin 81mg  daily alone. - Recommend DVT ppx - SBP goal - aim for gradual normotension. - Recommend Telemetry monitoring for arrythmia - Recommend bedside swallow screen prior to PO intake. - Stroke education booklet - Recommend PT/OT/SLP consult  ______________________________________________________________________   Thank you for the opportunity to take part in the care of this patient. If you have any further questions, please contact the neurology consultation attending.  Signed,  Steven Ramsey [facility] _ _ _   _ __   _ __ _ _  __ __   _ __  __ _  

## 2023-05-25 NOTE — Evaluation (Signed)
Speech Language Pathology Evaluation Patient Details Name: Steven Ramsey MRN: [id number] DOB: [birth year] Today's Date: 05/25/2023 Time: 7829-5621 SLP Time Calculation (min) (ACUTE ONLY): 14 min  Problem List:  Patient Active Problem List   Diagnosis Date Noted   Acute CVA (cerebrovascular accident) (HCC) 05/24/2023   Primary osteoarthritis of right knee 10/28/2017   Osteoarthritis of right knee 10/27/2017   Past Medical History:  Past Medical History:  Diagnosis Date   Arthritis    Asthma    Past Surgical History:  Past Surgical History:  Procedure Laterality Date   MENISCUS REPAIR Right    SHOULDER ARTHROSCOPY WITH ROTATOR CUFF REPAIR     Right   TOTAL KNEE ARTHROPLASTY Right 10/28/2017   TOTAL KNEE ARTHROPLASTY Right 10/28/2017   Procedure: RIGHT TOTAL KNEE ARTHROPLASTY;  Clemenson: Gean Birchwood, MD;  Location: [facility] OR;  Service: Orthopedics;  Laterality: Right;   WRIST SURGERY Left    HPI:  Steven Ramsey is a 52 yo male presenting to ED 8/17 with L facial numbness and altered speech. MRI Brain with acute perforator infarct affecting L basal ganglia. 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 He is independent at baseline, handling all finances and medications independently. Pt reports acute changes with speech at times sounding "slurred" and SLP observed mild dysarthria which does not affect intelligibility at the conversation level. He scored a 21/30 on the SLUMS (a score of 27 or above is considered WFL) characterized by difficulty with selective attention, memory, and problem solving. SLP provided education regarding increased assistance with complex cognitive tasks that may have previously been completed independently. Recommend further SLP f/u on an OP basis to address cognitive needs. Will s/o acutely.     SLP Assessment  SLP Recommendation/Assessment: All further Speech Lanaguage Pathology  needs can be addressed in the next venue of care SLP Visit Diagnosis: Cognitive communication deficit (R41.841)    Recommendations for follow up therapy are one component of a multi-disciplinary discharge planning process, led by the attending physician.  Recommendations may be updated based on patient status, additional functional criteria and insurance authorization.    Follow Up Recommendations  Outpatient SLP    Assistance Recommended at Discharge  PRN  Functional Status Assessment Patient has had a recent decline in their functional status and demonstrates the ability to make significant improvements in function in a reasonable and predictable amount of time.  Frequency and Duration           SLP Evaluation Cognition  Overall Cognitive Status: Impaired/Different from baseline Arousal/Alertness: Awake/alert Attention: Selective Selective Attention: Impaired Selective Attention Impairment: Verbal basic Memory: Impaired Memory Impairment: Retrieval deficit Awareness: Appears intact Problem Solving: Impaired Problem Solving Impairment: Verbal basic       Comprehension  Auditory Comprehension Overall Auditory Comprehension: Appears within functional limits for tasks assessed    Expression Expression Primary Mode of Expression: Verbal Verbal Expression Overall Verbal Expression: Appears within functional limits for tasks assessed Written Expression Dominant Hand: Right   Oral / Motor  Oral Motor/Sensory Function Overall Oral Motor/Sensory Function: Within functional limits Motor Speech Overall Motor Speech: Appears within functional limits for tasks assessed            Gwynneth Aliment, M.A., CF-SLP Speech Language Pathology, Acute Rehabilitation Services  Secure Chat preferred [phone]  05/25/2023, 10:49 AM

## 2023-05-25 NOTE — TOC Transition Note (Signed)
Transition of Care [facility]) - CM/SW Discharge Note   Patient Details  Name: Steven Ramsey Denbow MRN: [id number] Date of Birth: 1971-03-30  [facility] CM/SW Contact:  Kermit Balo, RN Phone Number: 05/25/2023, 4:21 PM   Clinical Narrative:     Pt is discharging home today.  Outpatient arranged with [facility]. Information on the AVS. Wife will transport home.  Final next level of care: OP Rehab Barriers to Discharge: No Barriers Identified   Patient Goals and CMS Choice CMS Medicare.gov Compare Post Acute Care list provided to:: Patient Choice offered to / list presented to : Patient, Spouse  Discharge Placement                         Discharge Plan and Services Additional resources added to the After Visit Summary for     Discharge Planning Services: CM Consult                                 Social Determinants of Health (SDOH) Interventions SDOH Screenings   Social Connections: Unknown (02/14/2022)   Received from [facility]  Tobacco Use: Low Risk  (05/23/2023)     Readmission Risk Interventions     No data to display

## 2023-05-25 NOTE — Evaluation (Signed)
Occupational Therapy Evaluation Patient Details Name: Steven Ramsey MRN: [id number] DOB: Jun 16, 1971 Today's Date: 05/25/2023   History of Present Illness 52 yo male presenting with L facial numbness and altered speech on 8/17. MRI (+) L basal ganglia PMH arthritis asthma R shoulder repair R TKA L wrist surgery   Clinical Impression   Patient evaluated by Occupational Therapy with no further acute OT needs identified. All education has been completed and the patient has no further questions. See below for any follow-up Occupational Therapy or equipment needs. OT to sign off. Thank you for referral.         If plan is discharge home, recommend the following: Direct supervision/assist for financial management;Direct supervision/assist for medications management    Functional Status Assessment     Equipment Recommendations  None recommended by OT    Recommendations for Other Services       Precautions / Restrictions Precautions Precautions: None Restrictions Weight Bearing Restrictions: No      Mobility Bed Mobility Overal bed mobility: Independent                  Transfers Overall transfer level: Independent                        Balance Overall balance assessment: Independent                               Standardized Balance Assessment Standardized Balance Assessment : Berg Balance Test, Dynamic Gait Index Berg Balance Test Sit to Stand: Able to stand without using hands and stabilize independently Standing Unsupported: Able to stand safely 2 minutes Sitting with Back Unsupported but Feet Supported on Floor or Stool: Able to sit safely and securely 2 minutes Stand to Sit: Sits safely with minimal use of hands Transfers: Able to transfer safely, minor use of hands Standing Unsupported with Eyes Closed: Able to stand 10 seconds safely Turn 360 Degrees: Able to turn 360 degrees safely in 4 seconds or less Standing Unsupported,  Alternately Place Feet on Step/Stool: Able to stand independently and safely and complete 8 steps in 20 seconds Dynamic Gait Index Level Surface: Normal Change in Gait Speed: Normal Gait with Horizontal Head Turns: Normal Gait with Vertical Head Turns: Normal Gait and Pivot Turn: Normal Step Over Obstacle: Normal Step Around Obstacles: Normal Steps: Normal Total Score: 24     ADL either performed or assessed with clinical judgement   ADL Overall ADL's : Independent                                             Vision Baseline Vision/History: 0 No visual deficits Ability to See in Adequate Light: 0 Adequate Vision Assessment?: No apparent visual deficits     Perception         Praxis         Pertinent Vitals/Pain Pain Assessment Pain Assessment: No/denies pain     Extremity/Trunk Assessment Upper Extremity Assessment Upper Extremity Assessment: Overall WFL for tasks assessed   Lower Extremity Assessment Lower Extremity Assessment: Overall WFL for tasks assessed   Cervical / Trunk Assessment Cervical / Trunk Assessment: Normal   Communication Communication Communication: No apparent difficulties   Cognition Arousal: Alert Behavior During Therapy: WFL for tasks assessed/performed Overall Cognitive Status: Within Functional Limits for  tasks assessed                                 General Comments: Do note that SLUMS with SLP revealed more deficits. Educated the wife and patient that higher executive task such as banking and medicine management should be done together. Pt advised to make sure to have others work with him during return to normal activities to watch for errors.     General Comments  VSS on RA    Exercises     Shoulder Instructions      Home Living Family/patient expects to be discharged to:: Private residence Living Arrangements: Spouse/significant other Available Help at Discharge: Family;Available  PRN/intermittently Type of Home: House Home Access: Stairs to enter [employer] of Steps: 6 Entrance Stairs-Rails: Right Home Layout: Two level;Bed/bath upstairs Alternate Level Stairs-Number of Steps: flight Alternate Level Stairs-Rails: Left Bathroom Shower/Tub: Producer, television/film/video: Handicapped height     Home Equipment: None   Additional Comments: 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  Lives With: Spouse    Prior Functioning/Environment Prior Level of Function : Independent/Modified Independent;Working/employed;Driving                        OT Problem List:        OT Treatment/Interventions:      OT Goals(Current goals can be found in the care plan section) Acute Rehab OT Goals Patient Stated Goal: to d/c home Potential to Achieve Goals: Good  OT Frequency:      Co-evaluation              AM-PAC OT "6 Clicks" Daily Activity     Outcome Measure Help from another person eating meals?: None Help from another person taking care of personal grooming?: None Help from another person toileting, which includes using toliet, bedpan, or urinal?: None Help from another person bathing (including washing, rinsing, drying)?: None Help from another person to put on and taking off regular upper body clothing?: None Help from another person to put on and taking off regular lower body clothing?: None 6 Click Score: 24   End of Session Equipment Utilized During Treatment: Gait belt Nurse Communication: Mobility status;Precautions  Activity Tolerance: Patient tolerated treatment well Patient left: in bed;with call bell/phone within reach;with family/visitor present  OT Visit Diagnosis: Unsteadiness on feet (R26.81)                Time: 6295-2841 OT Time Calculation (min): 16 min Charges:  OT General Charges $OT Visit: 1 Visit OT Evaluation $OT Eval Low Complexity: 1 Low   Brynn, OTR/L  Acute Rehabilitation Services Office:  [phone] .   Mateo Flow 05/25/2023, 12:55 PM

## 2023-05-25 NOTE — Progress Notes (Addendum)
STROKE TEAM PROGRESS NOTE   SUBJECTIVE (INTERVAL HISTORY) No family is at the bedside.  Overall his condition is completely resolved. He stated that he has been work out hard lately and there is a lot of stress on the job. He was having slurry speech and left facial numbness, but now all gone. However, his stroke on the left BG, not able to explain left facial numbness though.    OBJECTIVE Temp:  [98 F (36.7 C)-98.3 F (36.8 C)] 98.3 F (36.8 C) (08/19 1121) Pulse Rate:  [62-75] 70 (08/19 1121) Cardiac Rhythm: Normal sinus rhythm (08/19 0707) Resp:  [14-18] 18 (08/19 1121) BP: (120-142)/(57-86) 142/85 (08/19 1121) SpO2:  [99 %-100 %] 100 % (08/19 1121)  Recent Labs  Lab 05/23/23 2321  GLUCAP 117*   Recent Labs  Lab 05/23/23 2226  NA 138  K 3.6  CL 104  CO2 27  GLUCOSE 106*  BUN 21*  CREATININE 1.51*  CALCIUM 9.4   Recent Labs  Lab 05/23/23 2226  AST 71*  ALT 53*  ALKPHOS 60  BILITOT 0.8  PROT 7.8  ALBUMIN 4.5   Recent Labs  Lab 05/23/23 2226  WBC 5.5  NEUTROABS 3.2  HGB 14.1  HCT 45.4  MCV 75.3*  PLT 125*   No results for input(s): "CKTOTAL", "CKMB", "CKMBINDEX", "TROPONINI" in the last 168 hours. Recent Labs    05/23/23 2226  LABPROT 13.5  INR 1.0   Recent Labs    05/24/23 1416  COLORURINE YELLOW  LABSPEC >1.046*  PHURINE 6.0  GLUCOSEU NEGATIVE  HGBUR NEGATIVE  BILIRUBINUR NEGATIVE  KETONESUR NEGATIVE  PROTEINUR NEGATIVE  NITRITE NEGATIVE  LEUKOCYTESUR NEGATIVE       Component Value Date/Time   CHOL 162 05/25/2023 0801   TRIG 56 05/25/2023 0801   HDL 54 05/25/2023 0801   CHOLHDL 3.0 05/25/2023 0801   VLDL 11 05/25/2023 0801   LDLCALC 97 05/25/2023 0801   Lab Results  Component Value Date   HGBA1C 6.0 (H) 05/24/2023      Component Value Date/Time   LABOPIA NONE DETECTED 05/25/2023 0845   COCAINSCRNUR NONE DETECTED 05/25/2023 0845   LABBENZ NONE DETECTED 05/25/2023 0845   AMPHETMU NONE DETECTED 05/25/2023 0845   THCU NONE  DETECTED 05/25/2023 0845   LABBARB NONE DETECTED 05/25/2023 0845    Recent Labs  Lab 05/23/23 2226  ETH <10    I have personally reviewed the radiological images below and agree with the radiology interpretations.  VAS [place] TRANSCRANIAL DOPPLER W BUBBLES  Result Date: 05/25/2023  Transcranial Doppler with Bubble Patient Name:  Steven Ramsey  Date of Exam:   05/25/2023 Medical Rec #: [id number]          Accession #:    [id number] Date of Birth: 23-Jun-1971          Patient Gender: M Patient Age:   59 years Exam Location:  [facility] Procedure:      VAS [place] TRANSCRANIAL DOPPLER W BUBBLES Referring Phys: Scheryl Marten Kayode Petion --------------------------------------------------------------------------------  Indications: Stroke. History: Questionable PFO by transthoracic echo. Acute left basal ganglia perforator infarction. Comparison Study: No prior study on file Performing Technologist: Sherren Kerns RVS  Examination Guidelines: A complete evaluation includes B-mode imaging, spectral Doppler, color Doppler, and power Doppler as needed of all accessible portions of each vessel. Bilateral testing is considered an integral part of a complete examination. Limited examinations for reoccurring indications may be performed as noted.  Summary: No HITS at rest or during Valsalva. Negative transcranial  Doppler Bubble study with no evidence of right to left intracardiac communication.      Preliminary    ECHOCARDIOGRAM COMPLETE BUBBLE STUDY  Result Date: 05/25/2023    ECHOCARDIOGRAM REPORT   Patient Name:   Steven Ramsey Date of Exam: 05/25/2023 Medical Rec #:  [id number]         Height:       73.0 in Accession #:    [id number]        Weight:       230.0 lb Date of Birth:  04/21/1971         BSA:          2.283 m Patient Age:    52 years          BP:           142/85 mmHg Patient Gender: M                 HR:           74 bpm. Exam Location:  Inpatient Procedure: 2D Echo, Limited Color Doppler and Color Doppler  Indications:    stroke  History:        Patient has no prior history of Echocardiogram examinations.  Sonographer:    Delcie Roch RDCS Referring Phys: [id number] [facility] IMPRESSIONS  1. Left ventricular ejection fraction, by estimation, is 60 to 65%. The left ventricle has normal function. The left ventricle has no regional wall motion abnormalities. There is mild concentric left ventricular hypertrophy. Left ventricular diastolic parameters were normal.  2. Right ventricular systolic function is normal. The right ventricular size is normal.  3. The mitral valve is normal in structure. No evidence of mitral valve regurgitation. No evidence of mitral stenosis.  4. The aortic valve is tricuspid. Aortic valve regurgitation is not visualized. Aortic valve sclerosis is present, with no evidence of aortic valve stenosis.  5. The inferior vena cava is normal in size with greater than 50% respiratory variability, suggesting right atrial pressure of 3 mmHg.  6. Agitated saline contrast bubble study was positive with shunting observed within 3-6 cardiac cycles suggestive of interatrial shunt. Comparison(s): No prior Echocardiogram. FINDINGS  Left Ventricle: Left ventricular ejection fraction, by estimation, is 60 to 65%. The left ventricle has normal function. The left ventricle has no regional wall motion abnormalities. The left ventricular internal cavity size was normal in size. There is  mild concentric left ventricular hypertrophy. Left ventricular diastolic parameters were normal. Right Ventricle: The right ventricular size is normal. Right ventricular systolic function is normal. Left Atrium: Left atrial size was normal in size. Right Atrium: Right atrial size was normal in size. Pericardium: There is no evidence of pericardial effusion. Mitral Valve: The mitral valve is normal in structure. No evidence of mitral valve regurgitation. No evidence of mitral valve stenosis. Tricuspid Valve: The tricuspid  valve is normal in structure. Tricuspid valve regurgitation is not demonstrated. No evidence of tricuspid stenosis. Aortic Valve: The aortic valve is tricuspid. Aortic valve regurgitation is not visualized. Aortic valve sclerosis is present, with no evidence of aortic valve stenosis. Pulmonic Valve: The pulmonic valve was normal in structure. Pulmonic valve regurgitation is not visualized. No evidence of pulmonic stenosis. Aorta: The aortic root is normal in size and structure. Venous: The inferior vena cava is normal in size with greater than 50% respiratory variability, suggesting right atrial pressure of 3 mmHg. IAS/Shunts: No atrial level shunt detected by color flow Doppler. Agitated saline contrast was given  intravenously to evaluate for intracardiac shunting. Agitated saline contrast bubble study was positive with shunting observed within 3-6 cardiac cycles suggestive of interatrial shunt.  LEFT VENTRICLE PLAX 2D LVIDd:         4.70 cm   Diastology LVIDs:         3.10 cm   LV e' medial:    10.10 cm/s LV PW:         1.30 cm   LV E/e' medial:  6.6 LV IVS:        1.20 cm   LV e' lateral:   13.80 cm/s LVOT diam:     2.30 cm   LV E/e' lateral: 4.8 LV SV:         75 LV SV Index:   33 LVOT Area:     4.15 cm  RIGHT VENTRICLE             IVC RV Basal diam:  3.00 cm     IVC diam: 1.90 cm RV S prime:     12.60 cm/s TAPSE (M-mode): 2.5 cm LEFT ATRIUM             Index        RIGHT ATRIUM           Index LA diam:        3.70 cm 1.62 cm/m   RA Area:     17.70 cm LA Vol (A2C):   73.0 ml 31.97 ml/m  RA Volume:   49.00 ml  21.46 ml/m LA Vol (A4C):   59.9 ml 26.23 ml/m LA Biplane Vol: 66.8 ml 29.26 ml/m  AORTIC VALVE LVOT Vmax:   97.50 cm/s LVOT Vmean:  65.500 cm/s LVOT VTI:    0.181 m  AORTA Ao Root diam: 3.60 cm Ao Asc diam:  3.60 cm MITRAL VALVE MV Area (PHT): 3.65 cm    SHUNTS MV Decel Time: 208 msec    Systemic VTI:  0.18 m MV E velocity: 66.60 cm/s  Systemic Diam: 2.30 cm MV A velocity: 64.50 cm/s MV E/A ratio:   1.03 Olga Millers MD Electronically signed by Olga Millers MD Signature Date/Time: 05/25/2023/2:12:07 PM    Final    US RENAL  Result Date: 05/24/2023 CLINICAL DATA:  Acute renal insufficiency. EXAM: RENAL / URINARY TRACT ULTRASOUND COMPLETE COMPARISON:  None Available. FINDINGS: Examination difficult due to overlying bowel gas. Right Kidney: Renal measurements: 10.1 x 4.2 x 4.5 cm = volume: 100 mL. Echogenicity within normal limits. No mass or hydronephrosis visualized. Left Kidney: Renal measurements: 11.2 x 7.3 x 6.2 cm = volume: 265 mL. Echogenicity within normal limits. No mass or hydronephrosis visualized. Bladder: Appears normal for degree of bladder distention. Bilateral ureteral jets visualized. Other: None. IMPRESSION: Normal renal ultrasound. Electronically Signed   By: Elberta Fortis M.D.   On: 05/24/2023 12:12   CT ANGIO HEAD NECK W WO CM  Result Date: 05/24/2023 CLINICAL DATA:  Stroke/TIA, determine embolic source. Left-sided facial numbness and altered speech. EXAM: CT ANGIOGRAPHY HEAD AND NECK WITH AND WITHOUT CONTRAST TECHNIQUE: Multidetector CT imaging of the head and neck was performed using the standard protocol during bolus administration of intravenous contrast. Multiplanar CT image reconstructions and MIPs were obtained to evaluate the vascular anatomy. Carotid stenosis measurements (when applicable) are obtained utilizing NASCET criteria, using the distal internal carotid diameter as the denominator. RADIATION DOSE REDUCTION: This exam was performed according to the departmental dose-optimization program which includes automated exposure control, adjustment of the mA and/or kV according  to patient size and/or use of iterative reconstruction technique. CONTRAST:  75mL OMNIPAQUE IOHEXOL 350 MG/ML SOLN COMPARISON:  Head CT 05/23/2023.  MRI brain 05/24/2023. FINDINGS: CT HEAD FINDINGS Brain: No acute hemorrhage. Similar appearance of acute infarct in the left basal ganglia. No  significant mass effect or midline shift. No new loss of gray-white differentiation. No hydrocephalus or extra-axial collection. Vascular: No hyperdense vessel or unexpected calcification. Skull: No calvarial fracture or suspicious bone lesion. Skull base is unremarkable. Sinuses/Orbits: No acute finding. Other: None. Review of the MIP images confirms the above findings CTA NECK FINDINGS Aortic arch: Two-vessel arch configuration with common origin of the right brachiocephalic and left common carotid arteries. Arch vessel origins are patent. Right carotid system: No evidence of dissection, stenosis (50% or greater), or occlusion. Left carotid system: No evidence of dissection, stenosis (50% or greater), or occlusion. Vertebral arteries: Codominant. No evidence of dissection, stenosis (50% or greater), or occlusion. Skeleton: Unremarkable. Other neck: Unremarkable. Upper chest: Unremarkable. Review of the MIP images confirms the above findings CTA HEAD FINDINGS Anterior circulation: Calcified plaque along the carotid siphons without hemodynamically significant stenosis. The proximal ACAs and MCAs are patent without stenosis or aneurysm. Distal branches are symmetric. Posterior circulation: Normal basilar artery. The SCAs, AICAs and PICAs are patent proximally. The PCAs are patent proximally without stenosis or aneurysm. Distal branches are symmetric. Venous sinuses: Patent. Anatomic variants: Persistent fetal origin of the left PCA with hypoplastic left P1 segment. Review of the MIP images confirms the above findings IMPRESSION: 1. Similar appearance of acute infarct in the left basal ganglia. No acute hemorrhage. 2. No large vessel occlusion, hemodynamically significant stenosis, or aneurysm in the head or neck. Electronically Signed   By: Orvan Falconer M.D.   On: 05/24/2023 11:20   MR BRAIN WO CONTRAST  Result Date: 05/24/2023 CLINICAL DATA:  TIA EXAM: MRI HEAD WITHOUT CONTRAST TECHNIQUE: Multiplanar,  multiecho pulse sequences of the brain and surrounding structures were obtained without intravenous contrast. COMPARISON:  Head CT from yesterday FINDINGS: Brain: Band of restricted diffusion at the left basal ganglia and corona radiata. No hemorrhage, hydrocephalus, mass, or collection. Normal brain volume. Rare remote white matter insults. Vascular: Normal flow voids. Skull and upper cervical spine: Normal marrow signal. Sinuses/Orbits: Negative IMPRESSION: Acute perforator infarct affecting the left basal ganglia. Electronically Signed   By: Tiburcio Pea M.D.   On: 05/24/2023 09:14   CT Head Wo Contrast  Result Date: 05/23/2023 CLINICAL DATA:  Paresthesias. EXAM: CT HEAD WITHOUT CONTRAST TECHNIQUE: Contiguous axial images were obtained from the base of the skull through the vertex without intravenous contrast. RADIATION DOSE REDUCTION: This exam was performed according to the departmental dose-optimization program which includes automated exposure control, adjustment of the mA and/or kV according to patient size and/or use of iterative reconstruction technique. COMPARISON:  None Available. FINDINGS: Brain: No evidence of acute infarction, hemorrhage, hydrocephalus, extra-axial collection or mass lesion/mass effect. There is a small focal hypodensity in the left basal ganglia measuring 5 x 5 mm. Vascular: No hyperdense vessel or unexpected calcification. Skull: Normal. Negative for fracture or focal lesion. Sinuses/Orbits: No acute finding. Other: None. IMPRESSION: 1. No acute intracranial process. 2. Small focal hypodensity in the left basal ganglia measuring 5 x 5 mm. This may represent an old lacunar infarct versus prominent perivascular space. Electronically Signed   By: Darliss Cheney M.D.   On: 05/23/2023 22:36     PHYSICAL EXAM  Temp:  [98 F (36.7 C)-98.3 F (36.8 C)] 98.3  F (36.8 C) (08/19 1121) Pulse Rate:  [62-75] 70 (08/19 1121) Resp:  [14-18] 18 (08/19 1121) BP: (120-142)/(57-86)  142/85 (08/19 1121) SpO2:  [99 %-100 %] 100 % (08/19 1121)  General - Well nourished, well developed, in no apparent distress.  Ophthalmologic - fundi not visualized due to noncooperation.  Cardiovascular - Regular rhythm and rate.  Mental Status -  Level of arousal and orientation to time, place, and person were intact. Language including expression, naming, repetition, comprehension was assessed and found intact. Attention span and concentration were normal. Recent and remote memory were intact. Fund of Knowledge was assessed and was intact.  Cranial Nerves II - XII - II - Visual field intact OU. III, IV, VI - Extraocular movements intact. V - Facial sensation intact bilaterally. VII - Facial movement intact bilaterally. VIII - Hearing & vestibular intact bilaterally. X - Palate elevates symmetrically. XI - Chin turning & shoulder shrug intact bilaterally. XII - Tongue protrusion intact.  Motor Strength - The patient's strength was normal in all extremities and pronator drift was absent.  Bulk was normal and fasciculations were absent.   Motor Tone - Muscle tone was assessed at the neck and appendages and was normal.  Reflexes - The patient's reflexes were symmetrical in all extremities and he had no pathological reflexes.  Sensory - Light touch, temperature/pinprick were assessed and were symmetrical.    Coordination - The patient had normal movements in the hands and feet with no ataxia or dysmetria.  Tremor was absent.  Gait and Station - deferred.   ASSESSMENT/PLAN [name] is a 52 y.o. male with no significant past medical history admitted for slurry speech and left facial numbness, now all resolved. No tPA given due to symptoms resolved.    Stroke:  left BG/CR infarct likely secondary to small vessel disease source with dehydration and extensive work out CT ? Left BG hypodensity MRI  left BG/CR acute infarct CTA head and neck negative 2D Echo  EF  60-65%, questionable PFO TCD bubble study negative for PFO LDL 97 HgbA1c 6.0 UDS neg Lovenox for VTE prophylaxis No antithrombotic prior to admission, now on aspirin 81 mg daily and clopidogrel 75 mg daily DAPT for 3 weeks and then ASA alone.  Patient counseled to be compliant with his antithrombotic medications Ongoing aggressive stroke risk factor management Therapy recommendations:  none Disposition:  home  BP management  Stable Long term BP goal normotensive  Hyperlipidemia Home meds:  none  LDL 97, goal < 70 Now on lipitor 40 Continue statin at discharge  Elevated LFTs AST/ALT = 71/53 Likely due to extensive work out with sore muscle/muscle tissue break down Follow up with PCP  Other Stroke Risk Factors Obesity, Body mass index is 30.34 kg/m.   Other Active Problems AKI Cre 1.51, likely due to dehydration  Hospital day # 1  Neurology will sign off. Please call with questions. Pt will follow up with stroke clinic NP at [facility] in about 4 weeks. Thanks for the consult.   Marvel Plan, MD PhD Stroke Neurology 05/25/2023 4:05 PM  I discussed with Dr. Alanda Slim. I spent 30 min extra inpatient time with the patient, more than 50% of which was spent in counseling and coordination of care, reviewing test results, images and medication, and discussing the diagnosis, treatment plan and potential prognosis. This patient's care requiresreview of multiple databases, neurological assessment, other specialists and medical decision making of high complexity.      To contact Stroke Continuity  provider, please refer to [email or web address]. After hours, contact General Neurology

## 2023-05-25 NOTE — Plan of Care (Signed)

## 2023-05-29 NOTE — ED Notes (Addendum)
PO medication was ordered to aid in MRI because patient stated he is claustrophobic. A swallow screen was performed using water prior to administration of same. Patient had no issues drinking and swallowing the liquid

## 2023-06-04 NOTE — Therapy (Signed)
OUTPATIENT SPEECH LANGUAGE PATHOLOGY EVALUATION   Patient Name: Yonatan Zottola Marlatt MRN: [id number] DOB:[birth year], 52 y.o., male Today's Date: 06/05/2023  PCP: Ollen Bowl, MD REFERRING PROVIDER: Ollen Bowl, MD  END OF SESSION:  End of Session - 06/05/23 1002     Visit Number 1    Number of Visits 1    SLP Start Time 0930    SLP Stop Time  1002    SLP Time Calculation (min) 32 min    Activity Tolerance Patient tolerated treatment well             Past Medical History:  Diagnosis Date   Arthritis    Asthma    Past Surgical History:  Procedure Laterality Date   MENISCUS REPAIR Right    SHOULDER ARTHROSCOPY WITH ROTATOR CUFF REPAIR     Right   TOTAL KNEE ARTHROPLASTY Right 10/28/2017   TOTAL KNEE ARTHROPLASTY Right 10/28/2017   Procedure: RIGHT TOTAL KNEE ARTHROPLASTY;  Mcclenton: Gean Birchwood, MD;  Location: [facility] OR;  Service: Orthopedics;  Laterality: Right;   WRIST SURGERY Left    Patient Active Problem List   Diagnosis Date Noted   Obesity (BMI 30-39.9) 05/25/2023   Hyperlipidemia 05/25/2023   Elevated blood pressure reading 05/25/2023   Acute CVA (cerebrovascular accident) (HCC) 05/24/2023   Primary osteoarthritis of right knee 10/28/2017   Osteoarthritis of right knee 10/27/2017    ONSET DATE: 05/24/23   REFERRING DIAG: W09.81 (ICD-10-CM) - Personal history of transient ischemic attack (TIA), and cerebral infarction without residual deficits   THERAPY DIAG:  No diagnosis found.  Rationale for Evaluation and Treatment: Rehabilitation  SUBJECTIVE:   SUBJECTIVE STATEMENT: "Things are going good" Pt accompanied by: significant other, Monica   PERTINENT HISTORY: Zacaria Gegg Shammas is a 52 yo male presenting to ED 8/17 with L facial numbness and altered speech. MRI Brain with acute perforator infarct affecting L basal ganglia. PMH includes arthritis and ashtma   PAIN:  Are you having pain? No  FALLS: Has patient fallen in last 6 months?  See PT  evaluation for details  LIVING ENVIRONMENT: Lives with: lives with their family Lives in: House/apartment  PLOF:  Level of assistance: Independent with ADLs, Independent with IADLs Employment: Full-time employment, long haul truck driver  PATIENT GOALS: none stated   OBJECTIVE:   COGNITION: Overall cognitive status: Impaired Areas of impairment:  Memory: Impaired: Working Teacher, music term Awareness: Impaired: Intellectual and Leisure centre manager function: Impaired: Organization, Planning, and Self-correction Functional deficits: Pt and spouse deny functional deficits. Report return to normal activities with exception of work.   ORAL MOTOR EXAMINATION: Overall status: WFL  MOTOR SPEECH: assessed across variety of speech tasks: reading, word repetition, generative discourse sample Overall motor speech: Appears intact Intelligibility: Intelligible, 100% Comments: spouse reports overall speech has returned to baseline, though pt not talking as much when feeling pressure  PATIENT REPORTED OUTCOME MEASURES (PROM): deferred  TODAY'S TREATMENT:  DATE:  06/05/23: Education provided re: findings from completion of cognitive assessment and areas which pt struggled with. Provided education on strategies to use to accommodate suspected impairments should functional need arise. Pt and spouse verbalize understanding.   PATIENT EDUCATION: Education details: see above Person educated: Patient and Spouse Education method: Medical illustrator Education comprehension: verbalized understanding   ASSESSMENT:  CLINICAL IMPRESSION: Patient is a 52 y.o. M who was seen today for cognitive linguistic s/p L basal ganglia infarct. Evaluation reveals mild impairments in memory, awareness, and executive functioning. SLUMS completed with pt scoring 19/30, with  normal range being 27 or above. Deficits in working memory, short term memory evidenced during story recall, digit repetition task, and word recall task. Pt unable to correctly set time during clock drawing. Pt denies change in baseline cognition with spouse endorsing. Pt denies functional challenges and reports has returned to majority of previously completed acitivites without challenge. Pt does not desire to work with ST at this time. SLP educates on strategies to support memory and executive function and encourages pt and spouse to use reflection and planning to support potential challenges. Advised will need new ST referral should pt's needs change.  OBJECTIVE IMPAIRMENTS: include attention, memory, and executive functioning. These impairments are not limiting pt from any activities, per pt and spouse report.   PLAN:  SLP FREQUENCY: one time visit  SLP DURATION: other: eval and discharge   PLANNED INTERVENTIONS: Patient/family education  Maia Breslow, CCC-SLP 06/05/2023, 10:03 AM

## 2023-06-05 ENCOUNTER — Ambulatory Visit: Payer: 59 | Attending: Family Medicine | Admitting: Physical Therapy

## 2023-06-05 ENCOUNTER — Ambulatory Visit: Payer: 59 | Admitting: Speech Pathology

## 2023-06-05 VITALS — BP 156/95 | HR 67

## 2023-06-05 DIAGNOSIS — R41841 Cognitive communication deficit: Secondary | ICD-10-CM | POA: Diagnosis present

## 2023-06-05 DIAGNOSIS — M6281 Muscle weakness (generalized): Secondary | ICD-10-CM

## 2023-06-05 DIAGNOSIS — R2689 Other abnormalities of gait and mobility: Secondary | ICD-10-CM | POA: Insufficient documentation

## 2023-06-05 NOTE — Therapy (Signed)
OUTPATIENT PHYSICAL THERAPY NEURO EVALUATION   Patient Name: Steven Ramsey MRN: [id number] DOB:[birth year], 52 y.o., male Today's Date: 06/05/2023   PCP: Ollen Bowl, MD REFERRING PROVIDER: Ollen Bowl, MD  END OF SESSION:  PT End of Session - 06/05/23 1017     Visit Number 1    Number of Visits 5   with eval   Date for PT Re-Evaluation 07/17/23    Authorization Type [employer]    PT Start Time 1016    PT Stop Time 1048   eval   PT Time Calculation (min) 32 min    Equipment Utilized During Treatment Gait belt    Activity Tolerance Patient tolerated treatment well    Behavior During Therapy WFL for tasks assessed/performed             Past Medical History:  Diagnosis Date   Arthritis    Asthma    Past Surgical History:  Procedure Laterality Date   MENISCUS REPAIR Right    SHOULDER ARTHROSCOPY WITH ROTATOR CUFF REPAIR     Right   TOTAL KNEE ARTHROPLASTY Right 10/28/2017   TOTAL KNEE ARTHROPLASTY Right 10/28/2017   Procedure: RIGHT TOTAL KNEE ARTHROPLASTY;  Blatter: Gean Birchwood, MD;  Location: [facility] OR;  Service: Orthopedics;  Laterality: Right;   WRIST SURGERY Left    Patient Active Problem List   Diagnosis Date Noted   Obesity (BMI 30-39.9) 05/25/2023   Hyperlipidemia 05/25/2023   Elevated blood pressure reading 05/25/2023   Acute CVA (cerebrovascular accident) (HCC) 05/24/2023   Primary osteoarthritis of right knee 10/28/2017   Osteoarthritis of right knee 10/27/2017    ONSET DATE: 05/28/2023  REFERRING DIAG: G40.10 (ICD-10-CM) - Personal history of transient ischemic attack (TIA), and cerebral infarction without residual deficits  THERAPY DIAG:  Muscle weakness (generalized)  Other abnormalities of gait and mobility  Rationale for Evaluation and Treatment: Rehabilitation  SUBJECTIVE:                                                                                                                                                                                              SUBJECTIVE STATEMENT: Pt reports he was told he had a "mild stroke". Pt reports no vision changes but he has noticed some balance impairments, weakness in his RLE, and certain movements cause pain in his R thigh muscles. Pt reports that currently he is having trouble getting in/out of his truck and stepping up on higher surfaces. 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 Pt reports that it seems like his RLE will take longer to react when he tries to  move it. Prior to his stroke enjoyed working out at [employer] daily.  Pt accompanied by: self and wife Monica  PERTINENT HISTORY: PMH of asthma, L wrist surgery, and R shoulder arthroscopy.  PAIN:  Are you having pain? No  PRECAUTIONS: Fall  RED FLAGS: None   WEIGHT BEARING RESTRICTIONS: No  FALLS: Has patient fallen in last 6 months? 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  Head CT 05/23/2023 IMPRESSION: 1. No acute intracranial process. 2. Small focal hypodensity in the left basal ganglia measuring 5 x 5 mm. This may represent an old lacunar infarct versus prominent perivascular space.  COGNITION: Overall cognitive status: Within functional limits for tasks assessed   SENSATION: Reports no N/T on R side Light touch sensation WFL  POSTURE: No Significant postural limitations   LOWER EXTREMITY MMT:    MMT Right Eval Left Eval  Hip flexion 3 (pain in quad) 5  Hip extension    Hip abduction    Hip adduction    Hip internal rotation    Hip external rotation    Knee flexion 4 5  Knee extension 3 (pain in quad) 5  Ankle dorsiflexion 4 5  Ankle plantarflexion    Ankle inversion    Ankle eversion    (Blank  rows = not tested)  BED MOBILITY:  Mod I per pt report  TRANSFERS: Assistive device utilized: None  Sit to stand: Modified independence Stand to sit: Modified independence Chair to chair: Modified independence Floor:  not assessed at evaluation   STAIRS: Level of Assistance: Modified independence Stair Negotiation Technique: Alternating Pattern  with No Rails Number of Stairs: 6  Height of Stairs: 4  Comments: WFL  GAIT: Gait pattern: WFL Distance walked: various clinic distances Assistive device utilized: None Level of assistance: Modified independence Comments: WFL  FUNCTIONAL TESTS:    [facility] PT Assessment - 06/05/23 1034       Ambulation/Gait   Gait velocity 32.8 ft over 10.7 sec = 3.07 ft/sec      Standardized Balance Assessment   Standardized Balance Assessment Timed Up and Go Test;Five Times Sit to Stand    Five times sit to stand comments  18.56 sec   no UE support     Timed Up and Go Test   TUG Normal TUG    Normal TUG (seconds) 9.8   no AD     Functional Gait  Assessment   Gait assessed  Yes    Gait Level Surface Walks 20 ft in less than 7 sec but greater than 5.5 sec, uses assistive device, slower speed, mild gait deviations, or deviates 6-10 in outside of the 12 in walkway width.    Change in Gait Speed Able to smoothly change walking speed without loss of balance or gait deviation. Deviate no more than 6 in outside of the 12 in walkway width.    Gait with Horizontal Head Turns Performs head turns smoothly with no change in gait. Deviates no more than 6 in outside 12 in walkway width    Gait with Vertical Head Turns Performs head turns with no change in gait. Deviates no more than 6 in outside 12 in walkway width.    Gait and Pivot Turn Pivot turns safely within 3  sec and stops quickly with no loss of balance.    Step Over Obstacle Is able to step over 2 stacked shoe boxes taped together (9 in total height) without changing gait speed. No evidence of  imbalance.    Gait with Narrow Base of Support Is able to ambulate for 10 steps heel to toe with no staggering.    Gait with Eyes Closed Walks 20 ft, no assistive devices, good speed, no evidence of imbalance, normal gait pattern, deviates no more than 6 in outside 12 in walkway width. Ambulates 20 ft in less than 7 sec.    Ambulating Backwards Walks 20 ft, no assistive devices, good speed, no evidence for imbalance, normal gait    Steps Alternating feet, no rail.    Total Score 29    FGA comment: 29/30              TODAY'S TREATMENT:                                                                                                                               Vitals assessed while seated in chair. Educated pt and his wife on BP readings and provided handout of chart of BP readings. Encouraged pt and his wife to continue to monitor his BP at home and keep a log, bring in log to next session. Also educated on stroke risk factors and will continue to educate as therapy progresses.  Vitals:   06/05/23 1031  BP: (!) 156/95  Pulse: 67      PATIENT EDUCATION: Education details: Eval findings, PT POC, results of OM and functional implications, stroke risk factors and BP (see above) Person educated: Patient and Spouse Education method: Explanation Education comprehension: verbalized understanding and needs further education  HOME EXERCISE PROGRAM: To be initiated  GOALS: Goals reviewed with patient? Yes  SHORT TERM GOALS=LONG TERM GOALS due to length of POC   LONG TERM GOALS: Target date: 07/10/2023  Pt will be independent with final HEP for improved strength, balance, transfers and gait and return to exercise at a community fitness level. Baseline:  Goal status: INITIAL  2.  Pt will improve 5 x STS to less than or equal to 15 seconds to demonstrate improved functional strength and transfer efficiency.  Baseline: 18 sec no UE (8/30) Goal status: INITIAL  3.  Pt will improve  gait velocity to at least 3.25 ft/sec for improved gait efficiency and performance at mod I level  Baseline: 3.07 ft/sec no AD mod I (8/30) Goal status: INITIAL  4.  Pt will demonstrate good understanding of stroke risk factors as well as good awareness of BP goal and compliance with monitoring BP at least weekly at home Baseline:  Goal status: INITIAL   ASSESSMENT:  CLINICAL IMPRESSION: Patient is a 52 year old male referred to Neuro [facility] for TIA.   Pt's PMH is significant for: asthma, L wrist surgery, and R shoulder arthroscopy. The following  deficits were present during the exam: decreased RLE strength, impaired balance, increased pain. Pt would benefit from skilled PT to address these impairments and functional limitations to maximize functional mobility independence.   OBJECTIVE IMPAIRMENTS: decreased strength, impaired perceived functional ability, and pain.   ACTIVITY LIMITATIONS: carrying and lifting  PARTICIPATION LIMITATIONS: community activity and occupation  PERSONAL FACTORS: 1-2 comorbidities:   PMH of asthma, L wrist surgery, and R shoulder arthroscopy. are also affecting patient's functional outcome.   REHAB POTENTIAL: Excellent  CLINICAL DECISION MAKING: Stable/uncomplicated  EVALUATION COMPLEXITY: Low  PLAN:  PT FREQUENCY: 1x/week  PT DURATION: 4 weeks  PLANNED INTERVENTIONS: Therapeutic exercises, Therapeutic activity, Neuromuscular re-education, Balance training, Gait training, Patient/Family education, Self Care, Joint mobilization, Stair training, Dry Needling, Electrical stimulation, Cryotherapy, Moist heat, Taping, Manual therapy, and Re-evaluation  PLAN FOR NEXT SESSION: check BP, work on functional strengthening for RLE and return to community level fitness (what did pt enjoy doing at the gym prior to his TIA?), stroke education regarding risk factors and importance of monitoring BP at home    Peter Congo, PT, DPT, CSRS  06/05/2023, 10:48  AM

## 2023-06-19 ENCOUNTER — Encounter: Payer: Self-pay | Admitting: Physical Therapy

## 2023-06-19 ENCOUNTER — Ambulatory Visit: Payer: 59 | Attending: Internal Medicine | Admitting: Physical Therapy

## 2023-06-19 NOTE — Therapy (Signed)
[facility] [address] [place], [place], [address] Phone: [phone]   Fax:  [phone]  Patient Details  Name: Steven Ramsey MRN: [id number] Date of Birth: Aug 16, 1971 Referring Provider:  No ref. provider found  Encounter Date: 06/19/2023  Called pt and left VM regarding no-show to today's scheduled PT appointment. Informed pt of no-show policy and reminded him of next appointment date and time. Asked pt to call and cancel appointment >24 hours beforehand if he is unable to attend and provided clinic phone number.   Jill Alexanders Koraline Phillipson, PT 06/19/2023, 9:54 AM  [facility] [address] [place], [place], [address] Phone: [phone]   Fax:  [phone]

## 2023-06-23 ENCOUNTER — Inpatient Hospital Stay: Payer: Managed Care, Other (non HMO) | Admitting: Neurology

## 2023-06-26 ENCOUNTER — Ambulatory Visit: Payer: 59 | Admitting: Physical Therapy

## 2023-07-02 ENCOUNTER — Ambulatory Visit: Payer: 59 | Admitting: Adult Health

## 2023-07-02 ENCOUNTER — Encounter: Payer: Self-pay | Admitting: Adult Health

## 2023-07-02 VITALS — BP 130/78 | HR 63 | Ht 73.0 in | Wt 239.0 lb

## 2023-07-02 DIAGNOSIS — I639 Cerebral infarction, unspecified: Secondary | ICD-10-CM

## 2023-07-02 DIAGNOSIS — E785 Hyperlipidemia, unspecified: Secondary | ICD-10-CM | POA: Diagnosis not present

## 2023-07-02 NOTE — Progress Notes (Signed)
I agree with the above plan 

## 2023-07-02 NOTE — Patient Instructions (Addendum)
Continue aspirin 81 mg daily  and atorvastatin for secondary stroke prevention  Continue to follow up with PCP regarding cholesterol and pre-DM management  Maintain strict control of pre diabetes with hemoglobin A1c goal below 7.0 % and cholesterol with LDL cholesterol (bad cholesterol) goal below 70 mg/dL.   Signs of a Stroke? Follow the BEFAST method:  Balance Watch for a sudden loss of balance, trouble with coordination or vertigo Eyes Is there a sudden loss of vision in one or both eyes? Or double vision?  Face: Ask the person to smile. Does one side of the face droop or is it numb?  Arms: Ask the person to raise both arms. Does one arm drift downward? Is there weakness or numbness of a leg? Speech: Ask the person to repeat a simple phrase. Does the speech sound slurred/strange? Is the person confused ? Time: If you observe any of these signs, call 911.        Thank you for coming to see [place] at [facility]. I hope we have been able to provide you high quality care today.  You may receive a patient satisfaction survey over the next few weeks. We would appreciate your feedback and comments so that we may continue to improve ourselves and the health of our patients.       Stroke Prevention Some medical conditions and lifestyle choices can lead to a higher risk for a stroke. You can help to prevent a stroke by eating healthy foods and exercising. It also helps to not smoke and to manage any health problems you may have. How can this condition affect me? A stroke is an emergency. It should be treated right away. A stroke can lead to brain damage or threaten your life. There is a better chance of surviving and getting better after a stroke if you get medical help right away. What can increase my risk? The following medical conditions may increase your risk of a stroke: Diseases of the heart and blood vessels (cardiovascular disease). High blood pressure  (hypertension). Diabetes. High cholesterol. Sickle cell disease. Problems with blood clotting. Being very overweight. Sleeping problems (obstructivesleep apnea). Other risk factors include: Being older than age 25. A history of blood clots, stroke, or mini-stroke (TIA). Race, ethnic background, or a family history of stroke. Smoking or using tobacco products. Taking birth control pills, especially if you smoke. Heavy alcohol and drug use. Not being active. What actions can I take to prevent this? Manage your health conditions High cholesterol. Eat a healthy diet. If this is not enough to manage your cholesterol, you may need to take medicines. Take medicines as told by your doctor. High blood pressure. Try to keep your blood pressure below 130/80. If your blood pressure cannot be managed through a healthy diet and regular exercise, you may need to take medicines. Take medicines as told by your doctor. Ask your doctor if you should check your blood pressure at home. Have your blood pressure checked every year. Diabetes. Eat a healthy diet and get regular exercise. If your blood sugar (glucose) cannot be managed through diet and exercise, you may need to take medicines. Take medicines as told by your doctor. Talk to your doctor about getting checked for sleeping problems. Signs of a problem can include: Snoring a lot. Feeling very tired. Make sure that you manage any other conditions you have. Nutrition  Follow instructions from your doctor about what to eat or drink. You may be told to: Eat and  drink fewer calories each day. Limit how much salt (sodium) you use to 1,500 milligrams (mg) each day. Use only healthy fats for cooking, such as olive oil, canola oil, and sunflower oil. Eat healthy foods. To do this: Choose foods that are high in fiber. These include whole grains, and fresh fruits and vegetables. Eat at least 5 servings of fruits and vegetables a day. Try to fill  one-half of your plate with fruits and vegetables at each meal. Choose low-fat (lean) proteins. These include low-fat cuts of meat, chicken without skin, fish, tofu, beans, and nuts. Eat low-fat dairy products. Avoid foods that: Are high in salt. Have saturated fat. Have trans fat. Have cholesterol. Are processed or pre-made. Count how many carbohydrates you eat and drink each day. Lifestyle If you drink alcohol: Limit how much you have to: 0-1 drink a day for women who are not pregnant. 0-2 drinks a day for men. Know how much alcohol is in your drink. In the [place] one drink equals one 12 oz bottle of beer ( ), one 5 oz glass of wine ( ), or one 1 oz glass of hard liquor (44mL). Do not smoke or use any products that have nicotine or tobacco. If you need help quitting, ask your doctor. Avoid secondhand smoke. Do not use drugs. Activity  Try to stay at a healthy weight. Get at least 30 minutes of exercise on most days, such as: Fast walking. Biking. Swimming. Medicines Take over-the-counter and prescription medicines only as told by your doctor. Avoid taking birth control pills. Talk to your doctor about the risks of taking birth control pills if: You are over 50 years old. You smoke. You get very bad headaches. You have had a blood clot. Where to find more information American Stroke Association: www.strokeassociation.org Get help right away if: You or a loved one has any signs of a stroke. "BE FAST" is an easy way to remember the warning signs: B - Balance. Dizziness, sudden trouble walking, or loss of balance. E - Eyes. Trouble seeing or a change in how you see. F - Face. Sudden weakness or loss of feeling of the face. The face or eyelid may droop on one side. A - Arms. Weakness or loss of feeling in an arm. This happens all of a sudden and most often on one side of the body. S - Speech. Sudden trouble speaking, slurred speech, or trouble understanding what people  say. T - Time. Time to call emergency services. Write down what time symptoms started. You or a loved one has other signs of a stroke, such as: A sudden, very bad headache with no known cause. Feeling like you may vomit (nausea). Vomiting. A seizure. These symptoms may be an emergency. Get help right away. Call your local emergency services (911 in the [place] Do not wait to see if the symptoms will go away. Do not drive yourself to the hospital. Summary You can help to prevent a stroke by eating healthy, exercising, and not smoking. It also helps to manage any health problems you have. Do not smoke or use any products that contain nicotine or tobacco. Get help right away if you or a loved one has any signs of a stroke. This information is not intended to replace advice given to you by your health care provider. Make sure you discuss any questions you have with your health care provider. Document Revised: 08/25/2022 Document Reviewed: 08/25/2022 Elsevier Patient Education  2024 [employer].

## 2023-07-02 NOTE — Progress Notes (Addendum)
[facility] [address] [place]. [place] [address] [phone]       HOSPITAL FOLLOW UP NOTE  [name] Date of Birth:  [birth year] Medical Record Number:  [id number]   Reason for Referral:  hospital stroke follow up    SUBJECTIVE:   CHIEF COMPLAINT:  Chief Complaint  Patient presents with   New Patient (Initial Visit)    Patient in room #3 with his wife. Patient states he here to discuss stroke symptoms and what to look for.    HPI:   [name] is a 52 y.o. male with no significant past medical history who presented to [facility] ED on 05/23/2023 with slurry speech and left facial numbness, now all resolved. No tPA given due to symptoms resolved.  Stroke workup revealed left BG/CR infarct likely secondary to small vessel disease with dehydration and extensive workout.  CTA head/neck negative.  EF 60 to 65% with questional PFO.  TCD negative for PFO.  LDL 97.  A1c 6.0.  Recommended DAPT for 3 weeks and aspirin alone and initiated atorvastatin 40 mg daily, LDL 97.  Noted elevated LFTs likely due to extensive workout with sore muscles/muscle tissue breakdown, advised follow-up with PCP.  SLP recommended outpatient therapy to address cognitive impairment, no other therapy needs identified.    Today, 07/02/2023, patient is being seen for initial hospital follow-up accompanied by his wife.  Reports doing well since discharge.  Denies residual deficits and has returned back to all prior activities. 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 Denies new stroke/TIA symptoms.  Compliant on aspirin and atorvastatin, denies side effects Routinely follows with PCP for stroke risk factor management, had lab work completed today Trying to work on a healthy diet and has since returned back to the gym, has been working on ensuring  adequate water intake Denies tobacco, EtOH or recreational drug use     PERTINENT IMAGING  CT ? Left BG hypodensity MRI  left BG/CR acute infarct CTA head and neck negative 2D Echo  EF 60-65%, questionable PFO TCD bubble study negative for PFO LDL 97 HgbA1c 6.0    ROS:   14 system review of systems performed and negative with exception of those listed in HPI  PMH:  Past Medical History:  Diagnosis Date   Arthritis    Asthma     PSH:  Past Surgical History:  Procedure Laterality Date   MENISCUS REPAIR Right    SHOULDER ARTHROSCOPY WITH ROTATOR CUFF REPAIR     Right   TOTAL KNEE ARTHROPLASTY Right 10/28/2017   TOTAL KNEE ARTHROPLASTY Right 10/28/2017   Procedure: RIGHT TOTAL KNEE ARTHROPLASTY;  [place]: Gean Birchwood, MD;  Location: [facility] OR;  Service: Orthopedics;  Laterality: Right;   WRIST SURGERY Left     Social History:  Social History   Socioeconomic History   Marital status: Married    Spouse name: Not on file   Number of children: Not on file   Years of education: Not on file   Highest education level: Not on file  Occupational History   Not on file  Tobacco Use   Smoking status: Never   Smokeless tobacco: Never  Vaping Use   Vaping status: Never Used  Substance and Sexual Activity   Alcohol use: No   Drug use: No  Sexual activity: Not on file  Other Topics Concern   Not on file  Social History Narrative   Not on file   Social Determinants of Health   Financial Resource Strain: Not on file  Food Insecurity: Not on file  Transportation Needs: Not on file  Physical Activity: Not on file  Stress: Not on file  Social Connections: Unknown (02/14/2022)   [facility] from [facility]   Social Network    Social Network: Not on file  Intimate Partner Violence: Unknown (01/06/2022)   Received from [facility]   HITS    Physically Hurt: Not on file    Insult or Talk Down To: Not on file    Threaten Physical Harm: Not on file    Scream or Curse:  Not on file    Family History: History reviewed. No pertinent family history.  Medications:   Current Outpatient Medications on File Prior to Visit  Medication Sig Dispense Refill   albuterol (VENTOLIN HFA) 108 (90 Base) MCG/ACT inhaler Inhale 2 puffs into the lungs every 4 (four) hours as needed.     amoxicillin (AMOXIL) 500 MG capsule TAKE 4 TABLETS BY MOUTH 1 HOUR PRIOR TO DENTAL PROCEDURE     aspirin EC 81 MG tablet Take 1 tablet (81 mg total) by mouth daily. Swallow whole. 30 tablet 12   atorvastatin (LIPITOR) 40 MG tablet Take 1 tablet (40 mg total) by mouth daily. 90 tablet 1   gabapentin (NEURONTIN) 300 MG capsule Take 1 capsule (300 mg total) by mouth 3 (three) times daily. 90 capsule 2   No current facility-administered medications on file prior to visit.    Allergies:  No Known Allergies    OBJECTIVE:  Physical Exam  Vitals:   07/02/23 1019  BP: 130/78  Pulse: 63  Weight: 239 lb (108.4 kg)  Height: 6\' 1"  (1.854 m)   Body mass index is 31.53 kg/m. No results found.  General: well developed, well nourished, very pleasant middle-age African-American male, seated, in no evident distress Head: head normocephalic and atraumatic.   Neck: supple with no carotid or supraclavicular bruits Cardiovascular: regular rate and rhythm, no murmurs Musculoskeletal: no deformity Skin:  no rash/petichiae Vascular:  Normal pulses all extremities   Neurologic Exam Mental Status: Awake and fully alert.  Fluent speech and language.  Oriented to place and time. Recent and remote memory intact. Attention span, concentration and fund of knowledge appropriate. Mood and affect appropriate.  Cranial Nerves: Fundoscopic exam reveals sharp disc margins. Pupils equal, briskly reactive to light. Extraocular movements full without nystagmus. Visual fields full to confrontation. Hearing intact. Facial sensation intact. Face, tongue, palate moves normally and symmetrically.  Motor: Normal bulk  and tone. Normal strength in all tested extremity muscles Sensory.: intact to touch , pinprick , position and vibratory sensation.  Coordination: Rapid alternating movements normal in all extremities. Finger-to-nose and heel-to-shin performed accurately bilaterally. Gait and Station: Arises from chair without difficulty. Stance is normal. Gait demonstrates normal stride length and balance without use of AD. Tandem walk and heel toe without difficulty.  Reflexes: 1+ and symmetric. Toes downgoing.     NIHSS  0 Modified Rankin  0      ASSESSMENT: Vivan Degaetano Harland is a 52 y.o. year old male with left BG/CR infarct on 05/23/2023 likely secondary to small vessel disease source with dehydration and extensive workout. Vascular risk factors include HLD and pre-DM.      PLAN:  Left BG/CR stroke:  Recovered well without residual  deficit.  No activity or work restrictions from stroke standpoint Continue aspirin 81mg  daily and atorvastatin (Lipitor) for secondary stroke prevention.   Discussed secondary stroke prevention measures and importance of close PCP follow up for aggressive stroke risk factor management including HLD with LDL goal<70 and pre-DM with A1c.<7 .  Stroke labs 05/2023: LDL 97, A1c 6.0, had repeat lab work today I have gone over the pathophysiology of stroke, warning signs and symptoms, risk factors and their management in some detail with instructions to go to the closest emergency room for symptoms of concern.    Doing well from stroke standpoint without further recommendations and risk factors are managed by PCP. He may follow up PRN, as usual for our patients who are strictly being followed for stroke. If any new neurological issues should arise, request PCP place referral for evaluation by one of our neurologists. Thank you.     CC:  [facility] provider: Dr. Pearlean Brownie PCP: Ollen Bowl, MD    I spent 59 minutes of face-to-face and non-face-to-face time with patient and wife.   This included previsit chart review including review of recent hospitalization, lab review, study review, electronic health record documentation, patient and wife education and discussion regarding above diagnoses and treatment plan and answered all other questions to patient and wife satisfaction   Ihor Austin, AGNP-BC  [facility] [address] [place], [place] [address]  Phone [phone] Fax [phone] Note: This document was prepared with digital dictation and possible smart phrase technology. Any transcriptional errors that result from this process are unintentional.

## 2023-07-03 ENCOUNTER — Ambulatory Visit: Payer: 59 | Admitting: Physical Therapy

## 2023-07-10 ENCOUNTER — Ambulatory Visit: Payer: 59 | Admitting: Physical Therapy

## 2023-10-13 ENCOUNTER — Telehealth: Payer: Self-pay | Admitting: Adult Health

## 2023-10-13 NOTE — Telephone Encounter (Signed)
 [employer]) requesting dates of service for 10/06/2021-10/05/2022. Also need first office visit date and last visit seen by the neurologist and any upcoming appointments. Fax: [phone]. Informed would need a release form fax over with patient's signature to be able to release requested information.

## 2023-10-13 NOTE — Telephone Encounter (Signed)
 Confirmed with Steven Ramsey in medical records that she has already took care of this concern.

## 2023-11-02 ENCOUNTER — Telehealth: Payer: Self-pay | Admitting: Adult Health

## 2023-11-02 NOTE — Telephone Encounter (Signed)
Pt's wife cx appt due to pt being sick. Will call back to r/s

## 2023-11-03 ENCOUNTER — Ambulatory Visit: Payer: 59 | Admitting: Adult Health

## 2023-12-17 NOTE — Progress Notes (Signed)
 [facility] [address] [place]. [place] [address] [phone]       OFFICE FOLLOW UP NOTE  [name] Date of Birth:  1971/01/29 Medical Record Number:  [id number]   Reason for visit: Stroke follow up    SUBJECTIVE:   CHIEF COMPLAINT:  Chief Complaint  Patient presents with   Follow-up    Pt with wife, daughter. Rm 3. Overall pt has been doing well. Pt has a knee surgery coming up and needing clearance for that he has been having questions for follow up care. He has been off atorvastatin 40 mg due to liver enzymes elevated. He has not been taken asa 81 mg because pcp wasn't sure if should stay on that. Just wanting a visit to follow up get clarity on what needs to continue to do. And get clearance for upcoming surgery.     HPI:    Update 12/21/2023 JM: Patient returns per request for surgical clearance and further stroke prevention medication questions. Accompanied by his wife and daughter. Previously seen 6 months ago and advised to follow-up as needed is doing well from stroke standpoint.   He is scheduled to undergo total left knee replacement on 5/6 through [facility].   He has been taking his aspirin intermittently as he was unsure if continued daily use was indicated.  Denied any side effects while taking.  PCP discontinued atorvastatin due to elevated LFTs.  Patient reports improvement of LFTs but was never restarted on cholesterol medication (unable to view labs via epic). He has f/u with PCP this Thursday, 3/20, with plans on repeat lab work.  Blood pressure occasionally monitored at home which has been stable, typically elevated at appointments.  He continues to workout twice daily and has been greatly modifying his diet with avoidance of carbs, processed foods and low-sodium, has been eating more organic food.  He remains out of work as a [employer] but plans on returning around August which is 1 year post stroke (per DOT requirements).   Wife is concerned regarding increased stress levels with returning to work.     History provided for reference purposes only Initial visit 07/02/2023 JM: Patient is being seen for initial hospital follow-up accompanied by his wife.  Reports doing well since discharge.  Denies residual deficits and has returned back to all prior activities. 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 Denies new stroke/TIA symptoms.  Compliant on aspirin and atorvastatin, denies side effects Routinely follows with PCP for stroke risk factor management, had lab work completed today Trying to work on a healthy diet and has since returned back to the gym, has been working on ensuring adequate water intake Denies tobacco, EtOH or recreational drug use  Stroke admission 05/23/2023 [name] is a 53 y.o. male with no significant past medical history who presented to [facility] ED on 05/23/2023 with slurry speech and left facial numbness, now all resolved. No tPA given due to symptoms resolved.  Stroke workup revealed left BG/CR infarct likely secondary to small vessel disease with dehydration and extensive workout.  CTA head/neck negative.  EF 60 to 65% with questional PFO.  TCD negative for PFO.  LDL 97.  A1c 6.0.  Recommended DAPT for 3 weeks and aspirin alone and initiated atorvastatin 40 mg daily, LDL 97.  Noted elevated  LFTs likely due to extensive workout with sore muscles/muscle tissue breakdown, advised follow-up with PCP.  SLP recommended outpatient therapy to address cognitive impairment, no other therapy needs identified.     PERTINENT IMAGING  CT ? Left BG hypodensity MRI  left BG/CR acute infarct CTA head and neck negative 2D Echo  EF 60-65%, questionable PFO TCD bubble study negative for PFO LDL 97 HgbA1c 6.0    ROS:   14 system review of systems performed and  negative with exception of those listed in HPI  PMH:  Past Medical History:  Diagnosis Date   Arthritis    Asthma     PSH:  Past Surgical History:  Procedure Laterality Date   MENISCUS REPAIR Right    SHOULDER ARTHROSCOPY WITH ROTATOR CUFF REPAIR     Right   TOTAL KNEE ARTHROPLASTY Right 10/28/2017   TOTAL KNEE ARTHROPLASTY Right 10/28/2017   Procedure: RIGHT TOTAL KNEE ARTHROPLASTY;  [place]: Gean Birchwood, MD;  Location: [facility] OR;  Service: Orthopedics;  Laterality: Right;   WRIST SURGERY Left     Social History:  Social History   Socioeconomic History   Marital status: Married    Spouse name: Not on file   Number of children: Not on file   Years of education: Not on file   Highest education level: Not on file  Occupational History   Not on file  Tobacco Use   Smoking status: Never   Smokeless tobacco: Never  Vaping Use   Vaping status: Never Used  Substance and Sexual Activity   Alcohol use: No   Drug use: No   Sexual activity: Not on file  Other Topics Concern   Not on file  Social History Narrative   Not on file   Social Drivers of Health   Financial Resource Strain: Not on file  Food Insecurity: Not on file  Transportation Needs: Not on file  Physical Activity: Not on file  Stress: Not on file  Social Connections: Unknown (02/14/2022)   Received from [facility]   Social Network    Social Network: Not on file  Intimate Partner Violence: Unknown (01/06/2022)   Received from [facility]   HITS    Physically Hurt: Not on file    Insult or Talk Down To: Not on file    Threaten Physical Harm: Not on file    Scream or Curse: Not on file    Family History: No family history on file.  Medications:   Current Outpatient Medications on File Prior to Visit  Medication Sig Dispense Refill   albuterol (VENTOLIN HFA) 108 (90 Base) MCG/ACT inhaler Inhale 2 puffs into the lungs every 4 (four) hours as needed.     amoxicillin (AMOXIL) 500 MG capsule TAKE 4  TABLETS BY MOUTH 1 HOUR PRIOR TO DENTAL PROCEDURE     aspirin EC 81 MG tablet Take 1 tablet (81 mg total) by mouth daily. Swallow whole. (Patient not taking: Reported on 12/21/2023) 30 tablet 12   No current facility-administered medications on file prior to visit.    Allergies:  No Known Allergies    OBJECTIVE:  Physical Exam  Vitals:   12/21/23 0926  BP: (!) 152/84  Pulse: 81  Weight: 228 lb (103.4 kg)  Height: 6' (1.829 m)    Body mass index is 30.92 kg/m. No results found.  General: well developed, well nourished, very pleasant middle-age African-American male, seated, in no evident distress Head: head normocephalic and atraumatic.   Neck: supple with no  carotid or supraclavicular bruits Cardiovascular: regular rate and rhythm, no murmurs Musculoskeletal: no deformity Skin:  no rash/petichiae Vascular:  Normal pulses all extremities   Neurologic Exam Mental Status: Awake and fully alert.  Fluent speech and language.  Oriented to place and time. Recent and remote memory intact. Attention span, concentration and fund of knowledge appropriate. Mood and affect appropriate.  Cranial Nerves: Pupils equal, briskly reactive to light. Extraocular movements full without nystagmus. Visual fields full to confrontation. Hearing intact. Facial sensation intact. Face, tongue, palate moves normally and symmetrically.  Motor: Normal bulk and tone. Normal strength in all tested extremity muscles Sensory.: intact to touch , pinprick , position and vibratory sensation.  Coordination: Rapid alternating movements normal in all extremities. Finger-to-nose and heel-to-shin performed accurately bilaterally. Gait and Station: Arises from chair without difficulty. Stance is normal. Gait demonstrates normal stride length and balance without use of AD. Tandem walk and heel toe without difficulty.  Reflexes: 1+ and symmetric. Toes downgoing.       ASSESSMENT: Steven Ramsey is a 53 y.o. year  old male with left BG/CR infarct on 05/23/2023 likely secondary to small vessel disease source with dehydration and extensive workout. Vascular risk factors include HLD and pre-DM.      PLAN:  Left BG/CR stroke:  Recovered well without residual deficit.  No activity or work restrictions from stroke standpoint. Plan on returning back to work in 05/2024 as this is 1 year post stroke per DOT requirements Recommend restarting aspirin 81mg  daily and continue indefinitely for secondary stroke prevention.   Off statin therapy due to elevated LFTs, has repeat lab work this week, request results be faxed to office for review and further recommendations, consider initiating Crestor as this type of statin is not extensively metabolized through the liver Discussed secondary stroke prevention measures and importance of close PCP follow up for aggressive stroke risk factor management including HLD with LDL goal<70 and pre-DM with A1c.<7 .  I have gone over the pathophysiology of stroke, warning signs and symptoms, risk factors and their management in some detail with instructions to go to the closest emergency room for symptoms of concern. Surgical clearance: Scheduled to undergo total left knee replacement on 5/6 through [facility]. As stable from stroke standpoint without any new stroke/TIA in over 6 months, okay to proceed with surgery as long as he does not have any new stroke/TIAs or symptoms during the interval time.  Advise holding aspirin with timeframe determined by July with small but acceptable risk of recurrent stroke while off therapy and recommend restarting immediately after or once advised by [place].     Overall stable from stroke standpoint and routinely followed by PCP.  Patient can follow-up on an as-needed basis.  He was advised to call office with any questions or concerns in the future.    CC:  [facility] provider: Dr. Pearlean Brownie PCP: Ollen Bowl, MD    I spent 27 minutes of  face-to-face and non-face-to-face time with patient and wife.  This included previsit chart review, lab review, study review, order entry, electronic health record documentation, patient and wife education and discussion regarding above diagnoses and treatment plan and answered all other questions to patient and wife's satisfaction   Ihor Austin, AGNP-BC  [facility] [address] [place], [place] [address]  Phone [phone] Fax [phone] Note: This document was prepared with digital dictation and possible smart phrase technology. Any transcriptional errors that result from this process are unintentional.

## 2023-12-21 ENCOUNTER — Encounter: Payer: Self-pay | Admitting: Neurology

## 2023-12-21 ENCOUNTER — Encounter: Payer: Self-pay | Admitting: Adult Health

## 2023-12-21 ENCOUNTER — Ambulatory Visit: Payer: 59 | Admitting: Adult Health

## 2023-12-21 VITALS — BP 152/84 | HR 81 | Ht 72.0 in | Wt 228.0 lb

## 2023-12-21 DIAGNOSIS — I639 Cerebral infarction, unspecified: Secondary | ICD-10-CM | POA: Diagnosis not present

## 2023-12-21 DIAGNOSIS — E785 Hyperlipidemia, unspecified: Secondary | ICD-10-CM | POA: Diagnosis not present

## 2023-12-21 DIAGNOSIS — R7989 Other specified abnormal findings of blood chemistry: Secondary | ICD-10-CM | POA: Diagnosis not present

## 2023-12-21 NOTE — Patient Instructions (Addendum)
 Your Plan:  Restart aspirin 81mg  daily for secondary stroke prevention   Consider restarting cholesterol medication such as Crestor based on repeat lab work - request labs be faxed to office after completion for further review and recommendations   Okay to proceed with surgery as long as you remain stable without new stroke/TIA symptoms from now til then. You will hold aspirin prior to procedure as advised by your Schell[place]. Would recommend restarting aspirin after procedure or as advised by your Laurie     Follow up as needed, please do not hesitate to call or send MyChart message with any questions or concerns in the future      Thank you for coming to see [place] at [facility]. I hope we have been able to provide you high quality care today.  You may receive a patient satisfaction survey over the next few weeks. We would appreciate your feedback and comments so that we may continue to improve ourselves and the health of our patients.

## 2023-12-24 LAB — LAB REPORT - SCANNED
A1c: 6.1
EGFR: 71

## 2023-12-30 ENCOUNTER — Telehealth: Payer: Self-pay | Admitting: Adult Health

## 2023-12-30 NOTE — Telephone Encounter (Signed)
 Received recent lab work completed on 12/24/2023 by PCP which included BMP, CBC and A1c.  It does not include lipid panel or LFTs.  He reports previously being taken off statin due to elevated LFTs.  Can PCP office please be contacted to obtain last obtain lipid panel and LFTs? Thank you!

## 2023-12-31 NOTE — Telephone Encounter (Signed)
 Called patient to inform him that his PCP office doesn't have an recent Liver function or lipid panel done for him. Patient was little irritable and had sometime going at home. Patient states it was done and he can't keep giving blood. Pt mention for [place] to call [facility] for his labs again. I called and they faxed an Lab results from October of 2024 till present. Labs results was place in the basket for [place], NP.

## 2023-12-31 NOTE — Telephone Encounter (Signed)
 At recent visit, patient reported PCP discontinued statin due to elevated LFTs and recent lab work (prior to lab work in March) showed improvement of LFTs but no recommendation on restarting statin.  Please advise patient to follow-up with PCP to discuss repeating LFTs and lipid panel to make further decision on restarting statin therapy for further stroke prevention measures. Thank you.

## 2024-01-05 NOTE — Telephone Encounter (Signed)
 Received lab work from PCP including lipid panel and liver function test.  CMP 06/2023 showed elevated LFTs with AST 55 and ALT 67 and LDL 47, he was advised to hold statin.  Repeat lab work 09/2023 showed persistent elevated LFT with AST 67 and ALT 57.  Recommended proceeding with liver ultrasound for persistent elevated liver enzymes.  Does not appear repeat LFTs completed since that time and unable to view results of ultrasound.  Would recommend he continue to hold statin at this time although unsure if related to statin use as LFTs increased further despite holding statin for 42-month duration.  Will defer restarting statin therapy to PCP.

## 2024-02-26 ENCOUNTER — Telehealth: Payer: Self-pay | Admitting: Adult Health

## 2024-02-26 NOTE — Telephone Encounter (Signed)
 Pt's wife called wanting to know ifthe pt's lab lab results have been reviewed. Pt was to be informed of a cholesterol medication that he would be starting once the labs were reviewed but has not received a call back. Please advise.

## 2024-02-26 NOTE — Telephone Encounter (Signed)
 Spoke w/Pt regarding medication question for cholesterol control. Pt stated he had labs completed at PCP office and wanted to know when he could start back on medication and which med it would be. Informed Pt that our office has not been notified of  labs he had with PCP. Also discussed the note from [place], NP on 12/30/23 that she was deferring restarting statin therapy to his PCP d/t his elevated liver enzymes. Pt stated NP had commented on a medication that could be considered but couldn't recall the name. Referred to NP notes from [facility] on 12/21/23 for consideration of rosuvastatin (Crestor) based on repeat lab work as it not excessively metabolized through the liver. Again made Pt aware that NP is deferring the start of any statin medication to his PCP. Pt voiced understanding. Pt also requested this caller call back and leave a message on his cell VM so he will have the information. Cld Pt cell as requested and left VM of the above information.

## 2024-02-26 NOTE — Telephone Encounter (Signed)
 Cld wife, she requested a call to Pt. Cld Pt, no answer, LVM for call back.

## 2024-06-27 ENCOUNTER — Ambulatory Visit: Admitting: Internal Medicine

## 2024-07-10 ENCOUNTER — Encounter: Payer: Self-pay | Admitting: Adult Health

## 2024-07-11 ENCOUNTER — Telehealth: Payer: Self-pay | Admitting: Adult Health

## 2024-07-11 NOTE — Telephone Encounter (Signed)
 Pt came in to drop off forms to return to work, paid $50 form fee and I dropped off in POD 3

## 2024-07-11 NOTE — Telephone Encounter (Signed)
 Form received, placed in NP office for review and signature.

## 2024-07-12 ENCOUNTER — Encounter: Payer: Self-pay | Admitting: Adult Health

## 2024-07-12 DIAGNOSIS — Z0289 Encounter for other administrative examinations: Secondary | ICD-10-CM

## 2024-07-12 NOTE — Telephone Encounter (Signed)
 Form completed and is ready for NP signature.

## 2024-07-12 NOTE — Telephone Encounter (Signed)
 Patient said, [employer] did not accept the letter. [employer] informed would not approve patient to go back to work until receive information on the part of brain patient had a stroke (TIA) is located. Patient said can write on correction on the copy you have and refax it. Would like a call back.

## 2024-07-12 NOTE — Telephone Encounter (Signed)
 Signed. Thank you.

## 2024-07-12 NOTE — Telephone Encounter (Signed)
 Patient had a stroke in the basal ganglia area. Please add to paper as requested. Thank you.

## 2024-07-12 NOTE — Telephone Encounter (Signed)
 Form signed and sent to medical records for processing.

## 2024-07-13 NOTE — Telephone Encounter (Signed)
 Clarification to stroke area written on paper and given to medical records to refax.

## 2024-07-26 ENCOUNTER — Encounter: Payer: Self-pay | Admitting: Internal Medicine

## 2024-07-26 ENCOUNTER — Ambulatory Visit: Admitting: Internal Medicine

## 2024-07-26 VITALS — BP 158/100 | HR 77 | Temp 98.2°F | Ht 72.0 in | Wt 235.0 lb

## 2024-07-26 DIAGNOSIS — Z6831 Body mass index (BMI) 31.0-31.9, adult: Secondary | ICD-10-CM

## 2024-07-26 DIAGNOSIS — R7309 Other abnormal glucose: Secondary | ICD-10-CM | POA: Diagnosis not present

## 2024-07-26 DIAGNOSIS — Z0000 Encounter for general adult medical examination without abnormal findings: Secondary | ICD-10-CM | POA: Diagnosis not present

## 2024-07-26 DIAGNOSIS — Z5320 Procedure and treatment not carried out because of patient's decision for unspecified reasons: Secondary | ICD-10-CM

## 2024-07-26 DIAGNOSIS — K7689 Other specified diseases of liver: Secondary | ICD-10-CM

## 2024-07-26 DIAGNOSIS — E6609 Other obesity due to excess calories: Secondary | ICD-10-CM

## 2024-07-26 DIAGNOSIS — E66811 Obesity, class 1: Secondary | ICD-10-CM

## 2024-07-26 DIAGNOSIS — R030 Elevated blood-pressure reading, without diagnosis of hypertension: Secondary | ICD-10-CM | POA: Diagnosis not present

## 2024-07-26 DIAGNOSIS — Z8673 Personal history of transient ischemic attack (TIA), and cerebral infarction without residual deficits: Secondary | ICD-10-CM

## 2024-07-26 DIAGNOSIS — Z7689 Persons encountering health services in other specified circumstances: Secondary | ICD-10-CM

## 2024-07-26 DIAGNOSIS — Z2821 Immunization not carried out because of patient refusal: Secondary | ICD-10-CM

## 2024-07-26 NOTE — Progress Notes (Signed)
 Subjective:  Patient ID: Steven Ramsey , male    DOB: [birth year] , 53 y.o.   MRN: [id number]  Chief Complaint  Patient presents with   Establish Care    Patient presents today to establish care. He reports he last seen his pcp a couple months ago. He reports he feels like his previous pcp didn't understand.   Annual Exam    He would like to have his physical done today.    HPI Discussed the use of AI scribe software for clinical note transcription with the patient, who gave verbal consent to proceed.  History of Present Illness Steven Ramsey is a 53 year old male with a history of stroke who presents for establishment of care and evaluation of elevated blood pressure.  In August 2024, he experienced a stroke while working out at [employer], presenting with left-sided numbness, facial droop, and speech difficulties. He was diagnosed with a stroke at the hospital and discharged on Lipitor 40 mg, aspirin , and Plavix . He is currently not on cholesterol medication. He reports that his previous doctor and neurologist had differing recommendations regarding the necessity and dosage of cholesterol medication. He continues to take aspirin  daily and uses amoxicillin prophylactically before dental procedures.  He has a history of elevated blood pressure, which he attributes to nervousness and stress, particularly in the afternoons. He prefers morning appointments to reduce stress. 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 He is concerned about his weight and is actively trying to lose weight through diet and exercise, following a diet that includes egg whites, chicken sausage, and protein pancakes, while avoiding processed meats and high fructose corn syrup.  He has undergone multiple surgeries, including knee arthroplasty, meniscus repair, and shoulder surgery following a work-related injury where he fell off a truck. He also had wrist surgeries in 2015 and shoulder  surgery in 2016. He drinks a gallon of water daily and includes electrolytes in his water, which has helped prevent muscle cramps. He takes milk thistle and dandelion for liver health after seeing a liver specialist for a cyst found on ultrasound.  He is married with three children and works as a naval architect. He has completed a year of college and is currently in orientation for a new job. He has been out of work for a year due to the stroke and is trying to pass his DOT physical to return to work. No smoking history. Reports good bowel movements with increased bathroom frequency. No current muscle cramps or electrolyte imbalance symptoms.   Past Medical History:  Diagnosis Date   Arthritis    Asthma    Stroke (HCC) 05/22/2023     Family History  Problem Relation Age of Onset   Hypertension Mother    Lung cancer Mother    Diabetes Father    Cancer Maternal Grandmother      Current Outpatient Medications:    albuterol  (VENTOLIN  HFA) 108 (90 Base) MCG/ACT inhaler, Inhale 2 puffs into the lungs every 4 (four) hours as needed., Disp: , Rfl:    aspirin  EC 81 MG tablet, Take 1 tablet (81 mg total) by mouth daily. Swallow whole., Disp: 30 tablet, Rfl: 12   amLODipine (NORVASC) 2.5 MG tablet, TAKE 1 TABLET BY MOUTH NIGHTLY., Disp: 30 tablet, Rfl: 2   amoxicillin (AMOXIL) 500 MG capsule, TAKE 4 TABLETS BY MOUTH 1 HOUR PRIOR TO DENTAL PROCEDURE (Patient not taking: Reported on 07/26/2024), Disp: , Rfl:    No  Known Allergies   Review of Systems  Constitutional: Negative.   HENT: Negative.    Eyes: Negative.   Respiratory: Negative.    Cardiovascular: Negative.   Gastrointestinal: Negative.   Endocrine: Negative.   Genitourinary: Negative.   Musculoskeletal: Negative.   Skin: Negative.   Neurological: Negative.   Hematological: Negative.   Psychiatric/Behavioral: Negative.       Today's Vitals   07/26/24 1544 07/26/24 1645  BP: (!) 160/90 (!) 158/100  Pulse: 77   Temp: 98.2 F  (36.8 C)   TempSrc: Oral   Weight: 235 lb (106.6 kg)   Height: 6' (1.829 m)   PainSc: 0-No pain    Body mass index is 31.87 kg/m.  Wt Readings from Last 3 Encounters:  07/26/24 235 lb (106.6 kg)  12/21/23 228 lb (103.4 kg)  07/02/23 239 lb (108.4 kg)    The ASCVD Risk score (Arnett DK, et al., 2019) failed to calculate for the following reasons:   Risk score cannot be calculated because patient has a medical history suggesting prior/existing ASCVD  Objective:  Physical Exam Vitals and nursing note reviewed.  Constitutional:      Appearance: Normal appearance.  HENT:     Head: Normocephalic and atraumatic.     Right Ear: Tympanic membrane, ear canal and external ear normal.     Left Ear: Tympanic membrane, ear canal and external ear normal.     Nose: Nose normal.     Mouth/Throat:     Mouth: Mucous membranes are moist.     Pharynx: Oropharynx is clear.  Eyes:     Extraocular Movements: Extraocular movements intact.     Conjunctiva/sclera: Conjunctivae normal.     Pupils: Pupils are equal, round, and reactive to light.  Cardiovascular:     Rate and Rhythm: Normal rate and regular rhythm.     Pulses: Normal pulses.     Heart sounds: Normal heart sounds.  Pulmonary:     Effort: Pulmonary effort is normal.     Breath sounds: Normal breath sounds.  Chest:  Breasts:    Right: Normal. No swelling, bleeding, inverted nipple, mass or nipple discharge.     Left: Normal. No swelling, bleeding, inverted nipple, mass or nipple discharge.  Abdominal:     General: Abdomen is flat. Bowel sounds are normal.     Palpations: Abdomen is soft.  Genitourinary:    Comments: Deferred, per patient request Musculoskeletal:        General: Normal range of motion.     Cervical back: Normal range of motion and neck supple.     Comments: He has muscular build.  Skin:    General: Skin is warm.  Neurological:     General: No focal deficit present.     Mental Status: He is alert.   Psychiatric:        Mood and Affect: Mood normal.        Behavior: Behavior normal.         Assessment And Plan:  Encounter for general adult medical examination w/o abnormal findings Assessment & Plan: A full exam was performed.  DRE deferred, per patient request.  He is advised to get 30-45 minutes of regular exercise, no less than four to five days per week. Both weight-bearing and aerobic exercises are recommended.  He is advised to follow a healthy diet with at least six fruits/veggies per day, decrease intake of red meat and other saturated fats and to increase fish intake to twice weekly.  Meats/fish should not be fried -- baked, boiled or broiled is preferable. It is also important to cut back on your sugar intake.  Be sure to read labels - try to avoid anything with added sugar, high fructose corn syrup or other sweeteners.  If you must use a sweetener, you can try stevia or monkfruit.  It is also important to avoid artificially sweetened foods/beverages and diet drinks. Lastly, wear SPF 50 sunscreen on exposed skin and when in direct sunlight for an extended period of time.  Be sure to avoid fast food restaurants and aim for at least 60 ounces of water daily.      Orders: -     CMP14+EGFR; Future -     CBC; Future -     Lipid panel; Future -     PSA; Future -     TSH; Future  Elevated blood pressure reading Assessment & Plan: Blood pressure elevated today. Discussed diet and stress impact. - Recheck blood pressure today. - Encouraged dietary modifications to reduce sodium intake. - EKG performed, NSR w/ voltage criteria for LVH, -Anterolateral ST-elevation -repolarization variant.  - F/u in 2 weeks for NV  Orders: -     EKG 12-Lead  Liver cyst Assessment & Plan: Liver cyst identified on ultrasound. - Follow-up with liver specialist on October 30th.   Other abnormal glucose Assessment & Plan: A1c was 6.1 in March 2025, indicating prediabetes. - Order blood work to  recheck A1c. - He is encouraged to avoid processed foods, and those with HFCS and artificial sweeteners  Orders: -     Hemoglobin A1c; Future  Class 1 obesity due to excess calories with serious comorbidity and body mass index (BMI) of 31.0 to 31.9 in adult Assessment & Plan: Despite BMI of 31, I do not feel patient accurately falls in obese range. Must consider body fat percentage as well in someone with muscular build. Actively working on weight loss. Discussed dietary habits and weight management. - Encouraged reduction of processed meats. - Discussed potential impact of artificial sweeteners on gut health.   History of CVA (cerebrovascular accident) Assessment & Plan: Hospital records reviewed in detail.  Stroke in August 2024. Neurologist recommended lower Lipitor dose. Discussed cardiac calcium  score benefits. - Order blood work including cholesterol, liver and kidney function, and thyroid function. - Encouraged consideration of cardiac calcium  score.   Influenza vaccination declined  Screening for hepatitis C declined  Pneumococcal vaccination declined  Herpes zoster vaccination declined  Establishing care with new doctor, encounter for   Return for 1 year physical, 6 month bp.  Patient was given opportunity to ask questions. Patient verbalized understanding of the plan and was able to repeat key elements of the plan. All questions were answered to their satisfaction.   I, Catheryn LOISE Slocumb, MD, have reviewed all documentation for this visit. The documentation on 08/01/24 for the exam, diagnosis, procedures, and orders are all accurate and complete.   IF YOU HAVE BEEN REFERRED TO A SPECIALIST, IT MAY TAKE 1-2 WEEKS TO SCHEDULE/PROCESS THE REFERRAL. IF YOU HAVE NOT HEARD FROM US /SPECIALIST IN TWO WEEKS, PLEASE GIVE US  A CALL AT [phone].

## 2024-07-26 NOTE — Patient Instructions (Signed)
 Health Maintenance, Male  Adopting a healthy lifestyle and getting preventive care are important in promoting health and wellness. Ask your health care provider about:  The right schedule for you to have regular tests and exams.  Things you can do on your own to prevent diseases and keep yourself healthy.  What should I know about diet, weight, and exercise?  Eat a healthy diet    Eat a diet that includes plenty of vegetables, fruits, low-fat dairy products, and lean protein.  Do not eat a lot of foods that are high in solid fats, added sugars, or sodium.  Maintain a healthy weight  Body mass index (BMI) is a measurement that can be used to identify possible weight problems. It estimates body fat based on height and weight. Your health care provider can help determine your BMI and help you achieve or maintain a healthy weight.  Get regular exercise  Get regular exercise. This is one of the most important things you can do for your health. Most adults should:  Exercise for at least 150 minutes each week. The exercise should increase your heart rate and make you sweat (moderate-intensity exercise).  Do strengthening exercises at least twice a week. This is in addition to the moderate-intensity exercise.  Spend less time sitting. Even light physical activity can be beneficial.  Watch cholesterol and blood lipids  Have your blood tested for lipids and cholesterol at 54 years of age, then have this test every 5 years.  You may need to have your cholesterol levels checked more often if:  Your lipid or cholesterol levels are high.  You are older than 54 years of age.  You are at high risk for heart disease.  What should I know about cancer screening?  Many types of cancers can be detected early and may often be prevented. Depending on your health history and family history, you may need to have cancer screening at various ages. This may include screening for:  Colorectal cancer.  Prostate cancer.  Skin cancer.  Lung  cancer.  What should I know about heart disease, diabetes, and high blood pressure?  Blood pressure and heart disease  High blood pressure causes heart disease and increases the risk of stroke. This is more likely to develop in people who have high blood pressure readings or are overweight.  Talk with your health care provider about your target blood pressure readings.  Have your blood pressure checked:  Every 3-5 years if you are 24-52 years of age.  Every year if you are 3 years old or older.  If you are between the ages of 60 and 72 and are a current or former smoker, ask your health care provider if you should have a one-time screening for abdominal aortic aneurysm (AAA).  Diabetes  Have regular diabetes screenings. This checks your fasting blood sugar level. Have the screening done:  Once every three years after age 66 if you are at a normal weight and have a low risk for diabetes.  More often and at a younger age if you are overweight or have a high risk for diabetes.  What should I know about preventing infection?  Hepatitis B  If you have a higher risk for hepatitis B, you should be screened for this virus. Talk with your health care provider to find out if you are at risk for hepatitis B infection.  Hepatitis C  Blood testing is recommended for:  Everyone born from [birth year] through 1966.  Anyone  with known risk factors for hepatitis C.  Sexually transmitted infections (STIs)  You should be screened each year for STIs, including gonorrhea and chlamydia, if:  You are sexually active and are younger than 53 years of age.  You are older than 54 years of age and your health care provider tells you that you are at risk for this type of infection.  Your sexual activity has changed since you were last screened, and you are at increased risk for chlamydia or gonorrhea. Ask your health care provider if you are at risk.  Ask your health care provider about whether you are at high risk for HIV. Your health care provider  may recommend a prescription medicine to help prevent HIV infection. If you choose to take medicine to prevent HIV, you should first get tested for HIV. You should then be tested every 3 months for as long as you are taking the medicine.  Follow these instructions at home:  Alcohol use  Do not drink alcohol if your health care provider tells you not to drink.  If you drink alcohol:  Limit how much you have to 0-2 drinks a day.  Know how much alcohol is in your drink. In the [place] one drink equals one 12 oz bottle of beer (355 mL), one 5 oz glass of wine (148 mL), or one 1 oz glass of hard liquor (44 mL).  Lifestyle  Do not use any products that contain nicotine or tobacco. These products include cigarettes, chewing tobacco, and vaping devices, such as e-cigarettes. If you need help quitting, ask your health care provider.  Do not use street drugs.  Do not share needles.  Ask your health care provider for help if you need support or information about quitting drugs.  General instructions  Schedule regular health, dental, and eye exams.  Stay current with your vaccines.  Tell your health care provider if:  You often feel depressed.  You have ever been abused or do not feel safe at home.  Summary  Adopting a healthy lifestyle and getting preventive care are important in promoting health and wellness.  Follow your health care provider's instructions about healthy diet, exercising, and getting tested or screened for diseases.  Follow your health care provider's instructions on monitoring your cholesterol and blood pressure.  This information is not intended to replace advice given to you by your health care provider. Make sure you discuss any questions you have with your health care provider.  Document Revised: 02/11/2021 Document Reviewed: 02/11/2021  Elsevier Patient Education  2024 [employer].

## 2024-07-27 ENCOUNTER — Other Ambulatory Visit (INDEPENDENT_AMBULATORY_CARE_PROVIDER_SITE_OTHER)

## 2024-07-27 ENCOUNTER — Encounter: Payer: Self-pay | Admitting: Adult Health

## 2024-07-27 DIAGNOSIS — Z0000 Encounter for general adult medical examination without abnormal findings: Secondary | ICD-10-CM

## 2024-07-27 DIAGNOSIS — R7309 Other abnormal glucose: Secondary | ICD-10-CM

## 2024-07-27 LAB — POCT URINALYSIS DIP (CLINITEK)
Bilirubin, UA: NEGATIVE
Blood, UA: NEGATIVE
Glucose, UA: NEGATIVE mg/dL
Ketones, POC UA: NEGATIVE mg/dL
Leukocytes, UA: NEGATIVE — NL
Nitrite, UA: NEGATIVE — NL
POC PROTEIN,UA: NEGATIVE
Spec Grav, UA: 1.01 (ref 1.010–1.025)
Urobilinogen, UA: 0.2 U/dL
pH, UA: 7 (ref 5.0–8.0)

## 2024-07-27 NOTE — Progress Notes (Unsigned)
 I,Afia Messenger T Gerene Nedd, CMA,acting as a Neurosurgeon for [employer] documented all relevant documentation on the behalf of TIMA-NURSE,as directed by  [facility] while in the presence of TIMA-NURSE.  Subjective:  Patient ID: Steven Ramsey , male    DOB: [birth year] , 53 y.o.   MRN: [id number]  No chief complaint on file.   HPI  HPI   Past Medical History:  Diagnosis Date   Arthritis    Asthma    Stroke (HCC) 05/22/2023     Family History  Problem Relation Age of Onset   Hypertension Mother    Lung cancer Mother    Diabetes Father    Cancer Maternal Grandmother      Current Outpatient Medications:    albuterol  (VENTOLIN  HFA) 108 (90 Base) MCG/ACT inhaler, Inhale 2 puffs into the lungs every 4 (four) hours as needed., Disp: , Rfl:    amoxicillin (AMOXIL) 500 MG capsule, TAKE 4 TABLETS BY MOUTH 1 HOUR PRIOR TO DENTAL PROCEDURE (Patient not taking: Reported on 07/26/2024), Disp: , Rfl:    aspirin  EC 81 MG tablet, Take 1 tablet (81 mg total) by mouth daily. Swallow whole., Disp: 30 tablet, Rfl: 12   No Known Allergies   Review of Systems   There were no vitals filed for this visit. There is no height or weight on file to calculate BMI.  Wt Readings from Last 3 Encounters:  07/26/24 235 lb (106.6 kg)  12/21/23 228 lb (103.4 kg)  07/02/23 239 lb (108.4 kg)     Objective:  Physical Exam      Assessment And Plan:  Encounter for general adult medical examination w/o abnormal findings -     CMP14+EGFR -     CBC -     Lipid panel -     PSA -     TSH  Other abnormal glucose -     Hemoglobin A1c     No follow-ups on file.  Patient was given opportunity to ask questions. Patient verbalized understanding of the plan and was able to repeat key elements of the plan. All questions were answered to their satisfaction.  TIMA-NURSE  I, TIMA-NURSE, have reviewed all documentation for this visit. The documentation on 07/27/24 for the exam, diagnosis, procedures, and orders  are all accurate and complete.   IF YOU HAVE BEEN REFERRED TO A SPECIALIST, IT MAY TAKE 1-2 WEEKS TO SCHEDULE/PROCESS THE REFERRAL. IF YOU HAVE NOT HEARD FROM US /SPECIALIST IN TWO WEEKS, PLEASE GIVE US  A CALL AT [phone].   THE PATIENT IS ENCOURAGED TO PRACTICE SOCIAL DISTANCING DUE TO THE COVID-19 PANDEMIC.

## 2024-07-28 LAB — CMP14+EGFR
ALT: 38 IU/L (ref 0–44)
AST: 38 IU/L (ref 0–40)
Albumin: 4.7 g/dL (ref 3.8–4.9)
Alkaline Phosphatase: 58 IU/L (ref 47–123)
BUN/Creatinine Ratio: 13 (ref 9–20)
BUN: 17 mg/dL (ref 6–24)
Bilirubin Total: 0.9 mg/dL (ref 0.0–1.2)
CO2: 25 mmol/L (ref 20–29)
Calcium: 9.6 mg/dL (ref 8.7–10.2)
Chloride: 102 mmol/L (ref 96–106)
Creatinine, Ser: 1.29 mg/dL — ABNORMAL HIGH (ref 0.76–1.27)
Globulin, Total: 2.5 g/dL (ref 1.5–4.5)
Glucose: 82 mg/dL (ref 70–99)
Potassium: 4.5 mmol/L (ref 3.5–5.2)
Sodium: 141 mmol/L (ref 134–144)
Total Protein: 7.2 g/dL (ref 6.0–8.5)
eGFR: 66 mL/min/1.73 (ref >59)

## 2024-07-28 LAB — CBC
Hematocrit: 54.3 % — ABNORMAL HIGH (ref 37.5–51.0)
Hemoglobin: 16.6 g/dL (ref 13.0–17.7)
MCH: 23.6 pg — ABNORMAL LOW (ref 26.6–33.0)
MCHC: 30.6 g/dL — ABNORMAL LOW (ref 31.5–35.7)
MCV: 77 fL — ABNORMAL LOW (ref 79–97)
Platelets: 127 x10E3/uL — ABNORMAL LOW (ref 150–450)
RBC: 7.04 x10E6/uL (ref 4.14–5.80)
RDW: 17.3 % — ABNORMAL HIGH (ref 11.6–15.4)
WBC: 4.7 x10E3/uL (ref 3.4–10.8)

## 2024-07-28 LAB — LIPID PANEL
Chol/HDL Ratio: 3 ratio (ref 0.0–5.0)
Cholesterol, Total: 172 mg/dL (ref 100–199)
HDL: 58 mg/dL (ref >39)
LDL Chol Calc (NIH): 102 mg/dL — ABNORMAL HIGH (ref 0–99)
Triglycerides: 63 mg/dL (ref 0–149)
VLDL Cholesterol Cal: 12 mg/dL (ref 5–40)

## 2024-07-28 LAB — HEMOGLOBIN A1C
Est. average glucose Bld gHb Est-mCnc: 123 mg/dL
Hgb A1c MFr Bld: 5.9 % — ABNORMAL HIGH (ref 4.8–5.6)

## 2024-07-28 LAB — PSA: Prostate Specific Ag, Serum: 3.1 ng/mL (ref 0.0–4.0)

## 2024-07-28 LAB — TSH: TSH: 1.1 u[IU]/mL (ref 0.450–4.500)

## 2024-07-28 NOTE — Telephone Encounter (Signed)
 Can letter please be completed as patient requested? Thank you

## 2024-07-29 ENCOUNTER — Other Ambulatory Visit: Payer: Self-pay

## 2024-07-29 MED ORDER — AMLODIPINE BESYLATE 2.5 MG PO TABS: ORAL_TABLET | ORAL | 2 refills | Status: DC

## 2024-07-31 ENCOUNTER — Encounter: Payer: Self-pay | Admitting: Internal Medicine

## 2024-07-31 ENCOUNTER — Ambulatory Visit: Payer: Self-pay | Admitting: Internal Medicine

## 2024-08-01 DIAGNOSIS — E66811 Obesity, class 1: Secondary | ICD-10-CM | POA: Insufficient documentation

## 2024-08-01 DIAGNOSIS — R7309 Other abnormal glucose: Secondary | ICD-10-CM | POA: Insufficient documentation

## 2024-08-01 DIAGNOSIS — Z8673 Personal history of transient ischemic attack (TIA), and cerebral infarction without residual deficits: Secondary | ICD-10-CM | POA: Insufficient documentation

## 2024-08-01 DIAGNOSIS — K7689 Other specified diseases of liver: Secondary | ICD-10-CM | POA: Insufficient documentation

## 2024-08-01 DIAGNOSIS — Z0000 Encounter for general adult medical examination without abnormal findings: Secondary | ICD-10-CM | POA: Insufficient documentation

## 2024-08-01 NOTE — Assessment & Plan Note (Addendum)
 A1c was 6.1 in March 2025, indicating prediabetes. - Order blood work to recheck A1c. - He is encouraged to avoid processed foods, and those with HFCS and artificial sweeteners

## 2024-08-01 NOTE — Assessment & Plan Note (Signed)

## 2024-08-01 NOTE — Assessment & Plan Note (Signed)
 Despite BMI of 31, I do not feel patient accurately falls in obese range. Must consider body fat percentage as well in someone with muscular build. Actively working on weight loss. Discussed dietary habits and weight management. - Encouraged reduction of processed meats. - Discussed potential impact of artificial sweeteners on gut health.

## 2024-08-01 NOTE — Assessment & Plan Note (Signed)
 Liver cyst identified on ultrasound. - Follow-up with liver specialist on October 30th.

## 2024-08-01 NOTE — Assessment & Plan Note (Addendum)
 Blood pressure elevated today. Discussed diet and stress impact. - Recheck blood pressure today. - Encouraged dietary modifications to reduce sodium intake. - EKG performed, NSR w/ voltage criteria for LVH, -Anterolateral ST-elevation -repolarization variant.  - F/u in 2 weeks for NV

## 2024-08-01 NOTE — Assessment & Plan Note (Signed)
 Hospital records reviewed in detail.  Stroke in August 2024. Neurologist recommended lower Lipitor dose. Discussed cardiac calcium  score benefits. - Order blood work including cholesterol, liver and kidney function, and thyroid function. - Encouraged consideration of cardiac calcium  score.

## 2024-08-02 ENCOUNTER — Telehealth: Payer: Self-pay | Admitting: Adult Health

## 2024-08-02 NOTE — Telephone Encounter (Signed)
 Patient asking for letter. Did he not need this anymore?

## 2024-08-02 NOTE — Telephone Encounter (Signed)
 Pt wife called to follow up about letter that is suppose to be given for  Pt  new job stating Pt conditions.  Wife states she has not heard back and wanted to follow up /

## 2024-08-02 NOTE — Telephone Encounter (Signed)
 Steven Ramsey, this is the pt [name] spoke with you about. She told me to send you a phone note to call this pt to see exactly what he needs in his letter.

## 2024-08-03 NOTE — Telephone Encounter (Signed)
 I called pt and relayed that med eval letter done.  Med list and last OV attached.  He wil come by and pick up.  Placed up front behind check out on counter.  He verbalized understanding.

## 2024-08-03 NOTE — Telephone Encounter (Signed)
 Signed and returned back to POD 4. Thank you.

## 2024-08-05 ENCOUNTER — Ambulatory Visit

## 2024-08-09 ENCOUNTER — Ambulatory Visit: Payer: Self-pay

## 2024-08-09 VITALS — BP 120/80 | HR 79 | Temp 98.5°F | Ht 72.0 in | Wt 235.0 lb

## 2024-08-09 DIAGNOSIS — R030 Elevated blood-pressure reading, without diagnosis of hypertension: Secondary | ICD-10-CM

## 2024-08-09 NOTE — Progress Notes (Signed)
 Patient is in office today for a nurse visit for Blood Pressure Check. Taking amLODipine 2.5mg  PM. Patient blood pressure was 120/80, Patient No chest pain, No shortness of breath, No dyspnea on exertion, No orthopnea, No paroxysmal nocturnal dyspnea, No edema, No palpitations, No syncope. BP Readings from Last 3 Encounters:  08/09/24 120/80  07/26/24 (!) 158/100  12/21/23 (!) 152/84   Per provider- continue with current medications.

## 2024-09-07 ENCOUNTER — Other Ambulatory Visit: Payer: Self-pay | Admitting: Nurse Practitioner

## 2024-09-07 DIAGNOSIS — K7689 Other specified diseases of liver: Secondary | ICD-10-CM

## 2024-09-07 DIAGNOSIS — R748 Abnormal levels of other serum enzymes: Secondary | ICD-10-CM

## 2024-09-14 ENCOUNTER — Other Ambulatory Visit

## 2024-09-16 ENCOUNTER — Inpatient Hospital Stay: Admission: RE | Admit: 2024-09-16

## 2024-09-20 ENCOUNTER — Other Ambulatory Visit: Payer: Self-pay | Admitting: Internal Medicine

## 2024-10-04 ENCOUNTER — Inpatient Hospital Stay
Admission: RE | Admit: 2024-10-04 | Discharge: 2024-10-04 | Disposition: A | Source: Ambulatory Visit | Attending: Nurse Practitioner

## 2024-10-04 DIAGNOSIS — R748 Abnormal levels of other serum enzymes: Secondary | ICD-10-CM

## 2024-10-04 DIAGNOSIS — K7689 Other specified diseases of liver: Secondary | ICD-10-CM

## 2024-10-04 MED ORDER — IOPAMIDOL (ISOVUE-300) INJECTION 61%
100.0000 mL | Freq: Once | INTRAVENOUS | Status: AC | PRN
  Administered 2024-10-04: 100 mL via INTRAVENOUS

## 2024-11-09 ENCOUNTER — Ambulatory Visit: Admitting: Internal Medicine

## 2024-11-14 ENCOUNTER — Encounter: Admitting: Internal Medicine

## 2025-01-30 ENCOUNTER — Encounter: Payer: Self-pay | Admitting: Internal Medicine

## 2025-07-27 ENCOUNTER — Encounter: Admitting: Internal Medicine

## 2025-08-03 ENCOUNTER — Encounter: Payer: Self-pay | Admitting: Internal Medicine
